# Patient Record
Sex: Male | Born: 1955 | Race: White | Hispanic: No | Marital: Married | Smoking: Former smoker
Health system: Southern US, Community
[De-identification: ages and names within clinical notes are randomized; demographics above are authoritative.]

## PROBLEM LIST (undated history)

## (undated) DIAGNOSIS — I214 Non-ST elevation (NSTEMI) myocardial infarction: Secondary | ICD-10-CM

## (undated) DIAGNOSIS — E785 Hyperlipidemia, unspecified: Secondary | ICD-10-CM

## (undated) DIAGNOSIS — Z95820 Peripheral vascular angioplasty status with implants and grafts: Secondary | ICD-10-CM

## (undated) DIAGNOSIS — I2510 Atherosclerotic heart disease of native coronary artery without angina pectoris: Secondary | ICD-10-CM

## (undated) HISTORY — PX: APPENDECTOMY: SHX54

## (undated) HISTORY — DX: Hyperlipidemia, unspecified: E78.5

## (undated) HISTORY — DX: Peripheral vascular angioplasty status with implants and grafts: Z95.820

## (undated) HISTORY — DX: Non-ST elevation (NSTEMI) myocardial infarction: I21.4

## (undated) HISTORY — DX: Atherosclerotic heart disease of native coronary artery without angina pectoris: I25.10

---

## 2008-12-29 ENCOUNTER — Emergency Department (HOSPITAL_BASED_OUTPATIENT_CLINIC_OR_DEPARTMENT_OTHER): Admission: EM | Admit: 2008-12-29 | Discharge: 2008-12-29 | Payer: Self-pay | Admitting: Emergency Medicine

## 2010-08-07 LAB — DIFFERENTIAL
Basophils Relative: 2 % — ABNORMAL HIGH (ref 0–1)
Eosinophils Absolute: 0 10*3/uL (ref 0.0–0.7)
Lymphs Abs: 1.4 10*3/uL (ref 0.7–4.0)
Monocytes Absolute: 0.4 10*3/uL (ref 0.1–1.0)
Monocytes Relative: 3 % (ref 3–12)
Neutro Abs: 11.1 10*3/uL — ABNORMAL HIGH (ref 1.7–7.7)

## 2010-08-07 LAB — URINALYSIS, ROUTINE W REFLEX MICROSCOPIC
Glucose, UA: NEGATIVE mg/dL
Hgb urine dipstick: NEGATIVE
Protein, ur: NEGATIVE mg/dL
pH: 8.5 — ABNORMAL HIGH (ref 5.0–8.0)

## 2010-08-07 LAB — COMPREHENSIVE METABOLIC PANEL
ALT: 64 U/L — ABNORMAL HIGH (ref 0–53)
Albumin: 4.4 g/dL (ref 3.5–5.2)
Alkaline Phosphatase: 79 U/L (ref 39–117)
Calcium: 10.2 mg/dL (ref 8.4–10.5)
Potassium: 3.7 mEq/L (ref 3.5–5.1)
Sodium: 142 mEq/L (ref 135–145)
Total Protein: 7.8 g/dL (ref 6.0–8.3)

## 2010-08-07 LAB — CBC
Platelets: 173 10*3/uL (ref 150–400)
RDW: 12.3 % (ref 11.5–15.5)

## 2013-10-25 DIAGNOSIS — G4700 Insomnia, unspecified: Secondary | ICD-10-CM | POA: Insufficient documentation

## 2013-10-25 DIAGNOSIS — J449 Chronic obstructive pulmonary disease, unspecified: Secondary | ICD-10-CM | POA: Insufficient documentation

## 2013-10-25 DIAGNOSIS — D693 Immune thrombocytopenic purpura: Secondary | ICD-10-CM | POA: Insufficient documentation

## 2013-10-25 DIAGNOSIS — R351 Nocturia: Secondary | ICD-10-CM | POA: Insufficient documentation

## 2015-03-03 DIAGNOSIS — Z95820 Peripheral vascular angioplasty status with implants and grafts: Secondary | ICD-10-CM

## 2015-03-03 DIAGNOSIS — I2510 Atherosclerotic heart disease of native coronary artery without angina pectoris: Secondary | ICD-10-CM

## 2015-03-03 DIAGNOSIS — I214 Non-ST elevation (NSTEMI) myocardial infarction: Secondary | ICD-10-CM

## 2015-03-03 HISTORY — DX: Atherosclerotic heart disease of native coronary artery without angina pectoris: I25.10

## 2015-03-03 HISTORY — DX: Non-ST elevation (NSTEMI) myocardial infarction: I21.4

## 2015-03-03 HISTORY — DX: Peripheral vascular angioplasty status with implants and grafts: Z95.820

## 2015-03-05 ENCOUNTER — Inpatient Hospital Stay (HOSPITAL_BASED_OUTPATIENT_CLINIC_OR_DEPARTMENT_OTHER)
Admission: EM | Admit: 2015-03-05 | Discharge: 2015-03-07 | DRG: 247 | Disposition: A | Discharge status: Home / Self Care | Payer: BLUE CROSS/BLUE SHIELD | Attending: Cardiology | Admitting: Cardiology

## 2015-03-05 ENCOUNTER — Encounter (HOSPITAL_BASED_OUTPATIENT_CLINIC_OR_DEPARTMENT_OTHER): Payer: Self-pay | Admitting: Emergency Medicine

## 2015-03-05 ENCOUNTER — Emergency Department (HOSPITAL_BASED_OUTPATIENT_CLINIC_OR_DEPARTMENT_OTHER): Payer: BLUE CROSS/BLUE SHIELD

## 2015-03-05 DIAGNOSIS — E785 Hyperlipidemia, unspecified: Secondary | ICD-10-CM | POA: Diagnosis present

## 2015-03-05 DIAGNOSIS — Z8249 Family history of ischemic heart disease and other diseases of the circulatory system: Secondary | ICD-10-CM

## 2015-03-05 DIAGNOSIS — I214 Non-ST elevation (NSTEMI) myocardial infarction: Principal | ICD-10-CM | POA: Diagnosis present

## 2015-03-05 DIAGNOSIS — R001 Bradycardia, unspecified: Secondary | ICD-10-CM | POA: Diagnosis present

## 2015-03-05 DIAGNOSIS — I213 ST elevation (STEMI) myocardial infarction of unspecified site: Secondary | ICD-10-CM | POA: Diagnosis not present

## 2015-03-05 DIAGNOSIS — Z87891 Personal history of nicotine dependence: Secondary | ICD-10-CM

## 2015-03-05 DIAGNOSIS — Z880 Allergy status to penicillin: Secondary | ICD-10-CM | POA: Diagnosis not present

## 2015-03-05 DIAGNOSIS — I2510 Atherosclerotic heart disease of native coronary artery without angina pectoris: Secondary | ICD-10-CM | POA: Diagnosis not present

## 2015-03-05 DIAGNOSIS — E876 Hypokalemia: Secondary | ICD-10-CM | POA: Diagnosis present

## 2015-03-05 DIAGNOSIS — D72829 Elevated white blood cell count, unspecified: Secondary | ICD-10-CM | POA: Diagnosis present

## 2015-03-05 LAB — CBC WITH DIFFERENTIAL/PLATELET
BASOS ABS: 0 10*3/uL (ref 0.0–0.1)
Basophils Relative: 0 %
EOS PCT: 0 %
Eosinophils Absolute: 0 10*3/uL (ref 0.0–0.7)
HEMATOCRIT: 45.6 % (ref 39.0–52.0)
Hemoglobin: 16.4 g/dL (ref 13.0–17.0)
LYMPHS ABS: 1.3 10*3/uL (ref 0.7–4.0)
LYMPHS PCT: 11 %
MCH: 30.4 pg (ref 26.0–34.0)
MCHC: 36 g/dL (ref 30.0–36.0)
MCV: 84.6 fL (ref 78.0–100.0)
MONO ABS: 0.3 10*3/uL (ref 0.1–1.0)
MONOS PCT: 3 %
NEUTROS ABS: 9.7 10*3/uL — AB (ref 1.7–7.7)
Neutrophils Relative %: 86 %
PLATELETS: 206 10*3/uL (ref 150–400)
RBC: 5.39 MIL/uL (ref 4.22–5.81)
RDW: 12.4 % (ref 11.5–15.5)
WBC: 11.4 10*3/uL — ABNORMAL HIGH (ref 4.0–10.5)

## 2015-03-05 LAB — COMPREHENSIVE METABOLIC PANEL
ALBUMIN: 4.6 g/dL (ref 3.5–5.0)
ALT: 41 U/L (ref 17–63)
ANION GAP: 12 (ref 5–15)
AST: 33 U/L (ref 15–41)
Alkaline Phosphatase: 58 U/L (ref 38–126)
BILIRUBIN TOTAL: 0.8 mg/dL (ref 0.3–1.2)
BUN: 18 mg/dL (ref 6–20)
CHLORIDE: 105 mmol/L (ref 101–111)
CO2: 21 mmol/L — AB (ref 22–32)
Calcium: 9.5 mg/dL (ref 8.9–10.3)
Creatinine, Ser: 0.99 mg/dL (ref 0.61–1.24)
GFR calc Af Amer: >60 mL/min (ref >60)
GFR calc non Af Amer: >60 mL/min (ref >60)
GLUCOSE: 138 mg/dL — AB (ref 65–99)
POTASSIUM: 3.7 mmol/L (ref 3.5–5.1)
SODIUM: 138 mmol/L (ref 135–145)
TOTAL PROTEIN: 8 g/dL (ref 6.5–8.1)

## 2015-03-05 LAB — LIPASE, BLOOD
Lipase: 34 U/L (ref 11–51)
Lipase: 26 U/L (ref 11–51)

## 2015-03-05 LAB — MAGNESIUM: Magnesium: 1.7 mg/dL (ref 1.7–2.4)

## 2015-03-05 LAB — TROPONIN I
Troponin I: 0.39 ng/mL — ABNORMAL HIGH (ref <0.031)
Troponin I: 1.01 ng/mL (ref <0.031)

## 2015-03-05 LAB — AMYLASE: AMYLASE: 39 U/L (ref 28–100)

## 2015-03-05 LAB — HEPARIN LEVEL (UNFRACTIONATED): HEPARIN UNFRACTIONATED: 0.48 [IU]/mL (ref 0.30–0.70)

## 2015-03-05 MED ORDER — ACETAMINOPHEN 325 MG PO TABS
650.0000 mg | ORAL_TABLET | ORAL | Status: DC | PRN
  Administered 2015-03-06: 650 mg via ORAL
  Filled 2015-03-05: qty 2

## 2015-03-05 MED ORDER — NITROGLYCERIN IN D5W 200-5 MCG/ML-% IV SOLN: 3.0000 ug/min | INTRAVENOUS | Status: DC

## 2015-03-05 MED ORDER — ASPIRIN 81 MG PO CHEW
81.0000 mg | CHEWABLE_TABLET | Freq: Once | ORAL | Status: AC
  Administered 2015-03-05: 324 mg via ORAL

## 2015-03-05 MED ORDER — MORPHINE SULFATE (PF) 4 MG/ML IV SOLN
4.0000 mg | Freq: Once | INTRAVENOUS | Status: AC
  Administered 2015-03-05: 4 mg via INTRAVENOUS
  Filled 2015-03-05: qty 1

## 2015-03-05 MED ORDER — HEPARIN BOLUS VIA INFUSION
4000.0000 [IU] | Freq: Once | INTRAVENOUS | Status: AC
  Administered 2015-03-05: 4000 [IU] via INTRAVENOUS

## 2015-03-05 MED ORDER — HEPARIN (PORCINE) IN NACL 100-0.45 UNIT/ML-% IJ SOLN
1000.0000 [IU]/h | INTRAMUSCULAR | Status: DC
  Administered 2015-03-05: 1000 [IU]/h via INTRAVENOUS
  Administered 2015-03-05: 14:00:00 via INTRAVENOUS

## 2015-03-05 MED ORDER — ASPIRIN 81 MG PO CHEW
CHEWABLE_TABLET | ORAL | Status: AC
  Administered 2015-03-05: 324 mg via ORAL
  Filled 2015-03-05: qty 4

## 2015-03-05 MED ORDER — ATORVASTATIN CALCIUM 80 MG PO TABS
80.0000 mg | ORAL_TABLET | Freq: Every day | ORAL | Status: DC
  Administered 2015-03-06: 80 mg via ORAL
  Filled 2015-03-05: qty 1

## 2015-03-05 MED ORDER — HEPARIN (PORCINE) IN NACL 100-0.45 UNIT/ML-% IJ SOLN
INTRAMUSCULAR | Status: AC
Start: 2015-03-05 — End: 2015-03-05
  Filled 2015-03-05: qty 250

## 2015-03-05 MED ORDER — HEPARIN (PORCINE) IN NACL 100-0.45 UNIT/ML-% IJ SOLN
INTRAMUSCULAR | Status: AC
  Filled 2015-03-05: qty 250

## 2015-03-05 MED ORDER — IOHEXOL 300 MG/ML  SOLN
25.0000 mL | Freq: Once | INTRAMUSCULAR | Status: AC | PRN
  Administered 2015-03-05: 25 mL via ORAL

## 2015-03-05 MED ORDER — ASPIRIN 81 MG PO CHEW: 324.0000 mg | CHEWABLE_TABLET | ORAL | Status: AC

## 2015-03-05 MED ORDER — ASPIRIN EC 81 MG PO TBEC: 81.0000 mg | DELAYED_RELEASE_TABLET | Freq: Every day | ORAL | Status: DC

## 2015-03-05 MED ORDER — METOCLOPRAMIDE HCL 5 MG/ML IJ SOLN
10.0000 mg | Freq: Once | INTRAMUSCULAR | Status: AC
  Administered 2015-03-05: 10 mg via INTRAVENOUS
  Filled 2015-03-05: qty 2

## 2015-03-05 MED ORDER — ASPIRIN 81 MG PO CHEW: 324.0000 mg | CHEWABLE_TABLET | Freq: Once | ORAL | Status: DC

## 2015-03-05 MED ORDER — ASPIRIN 300 MG RE SUPP: 300.0000 mg | RECTAL | Status: AC

## 2015-03-05 MED ORDER — IOHEXOL 300 MG/ML  SOLN
100.0000 mL | Freq: Once | INTRAMUSCULAR | Status: AC | PRN
  Administered 2015-03-05: 100 mL via INTRAVENOUS

## 2015-03-05 MED ORDER — ONDANSETRON HCL 4 MG/2ML IJ SOLN
4.0000 mg | Freq: Four times a day (QID) | INTRAMUSCULAR | Status: DC | PRN
  Administered 2015-03-06: 4 mg via INTRAVENOUS
  Filled 2015-03-05: qty 2

## 2015-03-05 MED ORDER — NITROGLYCERIN IN D5W 200-5 MCG/ML-% IV SOLN
0.0000 ug/min | INTRAVENOUS | Status: DC
  Administered 2015-03-05: 10 ug/min via INTRAVENOUS
  Filled 2015-03-05: qty 250

## 2015-03-05 MED ORDER — NITROGLYCERIN 0.4 MG SL SUBL: 0.4000 mg | SUBLINGUAL_TABLET | SUBLINGUAL | Status: DC | PRN

## 2015-03-05 NOTE — ED Notes (Signed)
MD at bedside. 

## 2015-03-05 NOTE — ED Notes (Signed)
Patient transported to CT via stretcher, sr x 2 up  

## 2015-03-05 NOTE — Progress Notes (Signed)
Dr. Nona DellSamuel McDowell on unit at this time, spoke with RN pertaining to patient's recent troponin and plan of care for the night.  No new orders received at this time.  Will continue to monitor.

## 2015-03-05 NOTE — H&P (Addendum)
Admit date: 03/05/2015 Referring Physician: Dr. Dione Booze Primary Cardiologist None Chief complaint/reason for admission: elevated troponin with nausea  HPI: This is a 59 yo WM with no prior past medical history who was in his USOH until 3 days ago when he started feeling poorly.  He says it is difficult to described but did not feel right.  He was very fatigued but no other symptoms.  He went to work yesterday and about 2pm he started having a funny feeling in his epigastrium that he described as a heaviness and he went to the bathroom and vomited but continued to feel bad and went home and went to bed.  He slept for a few hours and felt better.  He went to visit his brother in the hospital last night and then went to bed around 11pm and awakened at 4am and had the same heaviness in his epigastric area and could not get relief.  He started vomiting again and decided to go to the [facility].  He says that he has had some SOB but the last time was a month ago.  He did get diaphoresis with the epigastric pain yesterday and last night.  He thought he had a stomach bug.  At [facility] ER he was noted to have an elevated troponin and is now admitted at [facility] for further w/u of NSTEMI.  Currently he is complaining of continued epigastric discomfort with nausea with some belching but the belching does not improve the discomfort.      PMH:   History reviewed. No pertinent past medical history.  PSH:    Past Surgical History  Procedure Laterality Date  . Appendectomy      ALLERGIES:   Penicillins  Prior to Admit Meds:   No prescriptions prior to admission   Family HX:    Family History  Problem Relation Age of Onset  . CVA Father   . Diverticulitis Brother   . Heart attack Maternal Grandfather 47    died of MI  . Heart disease Maternal Grandfather    Social HX:    Social History   Social History  . Marital Status: Married    Spouse Name: N/A  . Number of Children: N/A  . Years of Education:  N/A   Occupational History  . Not on file.   Social History Main Topics  . Smoking status: Former Smoker    Quit date: 03/04/2009  . Smokeless tobacco: Not on file  . Alcohol Use: No  . Drug Use: Not on file  . Sexual Activity: Not on file   Other Topics Concern  . Not on file   Social History Narrative  . No narrative on file     ROS:  All 11 ROS were addressed and are negative except what is stated in the HPI  PHYSICAL EXAM Filed Vitals:   03/05/15 1742  BP: 135/95  Pulse: 63  Temp: 99 F (37.2 C)  Resp: 15   General: Well developed, well nourished, in no acute distress Head: Eyes PERRLA, No xanthomas.   Normal cephalic and atramatic  Lungs:   Clear bilaterally to auscultation and percussion. Heart:   HRRR S1 S2 Pulses are 2+ & equal.            No carotid bruit. No JVD.  No abdominal bruits. No femoral bruits. Abdomen: Bowel sounds are positive, abdomen soft without masses and mild tenderness to palpation in the epigastric region.   Extremities:   No clubbing,  cyanosis or edema.  DP +1 Neuro: Alert and oriented X 3. Psych:  Good affect, responds appropriately   Labs:   Lab Results  Component Value Date   WBC 11.4* 03/05/2015   HGB 16.4 03/05/2015   HCT 45.6 03/05/2015   MCV 84.6 03/05/2015   PLT 206 03/05/2015     Recent Labs Lab 03/05/15 0955  NA 138  K 3.7  CL 105  CO2 21*  BUN 18  CREATININE 0.99  CALCIUM 9.5  PROT 8.0  BILITOT 0.8  ALKPHOS 58  ALT 41  AST 33  GLUCOSE 138*   Lab Results  Component Value Date   TROPONINI 0.39* 03/05/2015   No results found for: PTT No results found for: INR, PROTIME  No results found for: CHOL No results found for: HDL No results found for: LDLCALC No results found for: TRIG No results found for: CHOLHDL No results found for: LDLDIRECT    Radiology:  Dg Chest 2 View  03/05/2015  CLINICAL DATA:  Chest pain with nausea and vomiting. Irregular heart rate EXAM: CHEST  2 VIEW COMPARISON:  None.  FINDINGS: The heart size and mediastinal contours are within normal limits. Both lungs are clear. The visualized skeletal structures are unremarkable. IMPRESSION: No active cardiopulmonary disease. Electronically Signed   By: Franchot Gallo M.D.   On: 03/05/2015 13:11   Ct Abdomen Pelvis W Contrast  03/05/2015  CLINICAL DATA:  Abdominal pain, nausea, vomiting, body aches, chills, status post appendectomy. EXAM: CT ABDOMEN AND PELVIS WITH CONTRAST TECHNIQUE: Multidetector CT imaging of the abdomen and pelvis was performed using the standard protocol following bolus administration of intravenous contrast. CONTRAST:  48mL OMNIPAQUE IOHEXOL 300 MG/ML SOLN, 170mL OMNIPAQUE IOHEXOL 300 MG/ML SOLN COMPARISON:  None. FINDINGS: Lung bases are unremarkable. Sagittal images of the spine are unremarkable. Enhanced liver, pancreas, spleen and adrenal glands are unremarkable. No calcified gallstones are noted within gallbladder. Kidneys are symmetrical in size and enhancement. No hydronephrosis or hydroureter. There is cortical scarring midpole of the left kidney. No small bowel obstruction. No ascites or free air. No adenopathy. No pericecal inflammation. The terminal ileum is unremarkable. Tiny umbilical hernia containing fat without evidence of acute complication. Some stool noted in right colon and transverse colon. The left colon and sigmoid colon are empty partially collapsed. Some colonic gas noted within rectum. Prostate gland and seminal vesicles are unremarkable. Urinary bladder is unremarkable. There is no inguinal adenopathy. No destructive bony lesions are noted within pelvis. IMPRESSION: 1. No acute inflammatory process within abdomen or pelvis. 2. No pericecal inflammation.  Unremarkable terminal ileum. 3. No hydronephrosis or hydroureter. 4. No small bowel obstruction. Electronically Signed   By: Lahoma Crocker M.D.   On: 03/05/2015 12:53    EKG:  NSR with IRBBB and no acute ST changes  ASSESSMENT/PLAN:   1.   Epigastric pain with N/V ? Etiology.  Patient has not felt well for 3 days and over past 24 hours has had epigastric pain with N/V and diaphoresis that is not relieved with vomiting or belching.  Started on IV Heparin gtt and continues to have discomfort.  Initial Trop is 0.39.  EKG is nonischemic.  Lipase is normal and amylase is pending.  Alk Phosis normal.  CT of abdomen was normal.  His risk factors include male sex, age > 64 and family  History of premature CAD with his maternal grandfather dying of massive MI at 73.  EKG does not show any acute changes but may have  a LCx lesion.  I have discussed findings with Dr. Angelena Form.  Will continue IV Heparin gtt.  Start IV NTG gtt.  Start ASA $RemoveB'81mg'okqjsHPr$  daily.  Hold BB due to borderline bradycardia.  Start high dose statin.  Cycle cardiac enzymes.  If troponin trends upward then Dr. Angelena Form will take to cath lab tonight.  Check 2D echo in am.  Check FLP and HbA1C in am.    Sueanne Margarita, MD  03/05/2015  6:15 PM

## 2015-03-05 NOTE — ED Notes (Signed)
Patient resting and states nausea has improved.  Patient AAO X 4.  Patient's body slightly moist.  Infusions being administered.  No redness, pain, drainage or edema at either site.

## 2015-03-05 NOTE — Progress Notes (Signed)
ANTICOAGULATION CONSULT NOTE - Initial Consult  Pharmacy Consult for heparin Indication: chest pain/ACS  Allergies  Allergen Reactions  . Penicillins     Patient Measurements: Height: 5\' 8"  (172.7 cm) Weight: 185 lb (83.915 kg) IBW/kg (Calculated) : 68.4 Heparin Dosing Weight: 83kg  Vital Signs: Temp: 98 F (36.7 C) (11/03 1106) Temp Source: Oral (11/03 1106) BP: 146/99 mmHg (11/03 1331) Pulse Rate: 58 (11/03 1331)  Labs:  Recent Labs  03/05/15 0955  HGB 16.4  HCT 45.6  PLT 206  CREATININE 0.99  TROPONINI 0.39*    Estimated Creatinine Clearance: 86.9 mL/min (by C-G formula based on Cr of 0.99).   Assessment: 89 yom with no past cardiac history [facility] with chest pain and generalized malaise. First set of cardiac enzymes positive. Heparin ordered for rule out MI. CBC within normal limits.  Goal of Therapy:  Heparin level 0.3-0.7 units/ml Monitor platelets by anticoagulation protocol: Yes   Plan:  Give 4000 units bolus x 1 Start heparin infusion at 1000 units/hr Check anti-Xa level in 6 hours and daily while on heparin Continue to monitor H&H and platelets  Sheppard CoilFrank Ruthella Kirchman PharmD., BCPS Clinical Pharmacist Pager [phone] 03/05/2015 1:45 PM

## 2015-03-05 NOTE — ED Notes (Signed)
carelink will transfer to [phone]

## 2015-03-05 NOTE — ED Notes (Signed)
Pt placed on cont cardiac monitoring with cont POX and int NBP measurements

## 2015-03-05 NOTE — ED Notes (Signed)
Presents today with abd pain, nausea, vomited x 2, last time approx 1 hour ago, states ate last night, last BM was this am

## 2015-03-05 NOTE — ED Notes (Signed)
Flu like symptoms since Tuesday.  Body aches.  Chills, hot flashes.  N/V.  Clammy.

## 2015-03-05 NOTE — ED Notes (Signed)
Pt resting quietly with eyes closed, cont on cardiac monitor, S-brady noted at this time with occ unifocal PVC now noted

## 2015-03-05 NOTE — ED Notes (Signed)
Patient returned from CT.  Pt pale.  Vitals repeated.  EKG repeated after noting troponin results.

## 2015-03-05 NOTE — Progress Notes (Signed)
ANTICOAGULATION CONSULT NOTE - FOLLOW UP    HL = 0.48 (goal 0.3 - 0.7 units/mL) Heparin dosing weight = 83 kg   Assessment: 59 YOM on IV heparin for ACS.  Heparin level therapeutic; no bleeding reported.   Plan: - Continue heparin gtt at 1000 units/hr - F/U AM labs    Shanesha Bednarz D. Laney Potashang, PharmD, BCPS 03/05/2015, 9:02 PM

## 2015-03-05 NOTE — ED Notes (Signed)
Due to variability in HR, NBP adjusted to q5715min assessments, pt cont on cardiac monitoring with cont POX, safety measures ensured secondary to IV narcotic administration. Family at bedside

## 2015-03-05 NOTE — Progress Notes (Signed)
Patient recent Troponin 1.01.  Patient denying chest pain and nausea currently. Cardiology paged with this information.

## 2015-03-05 NOTE — Progress Notes (Signed)
    Primary cardiologist: Dr. Fransico Him  Followed up on repeat troponin I this evening. Increase noted from 0.39 up to 1.01. Patient was placed on IV nitroglycerin along with heparin by Dr. Radford Pax, he has been chest pain-free recently, in fact now sleeping comfortably. I met with patient's wife, her sister and husband in the room. Updated them on the lab work. I also communicated with Dr. Angelena Form with plan now being to anticipate cardiac catheterization in the morning, sooner if he becomes unstable. Discussed with nursing.   Satira Sark, M.D., F.A.C.C.

## 2015-03-05 NOTE — ED Provider Notes (Signed)
CSN: [id number]     Arrival date & time 03/05/15  7829 History   First MD Initiated Contact with Patient 03/05/15 1114     Chief Complaint  Patient presents with  . Nausea     (Consider location/radiation/quality/duration/timing/severity/associated sxs/prior Treatment) HPI Complains of generalized malaise for the past 2 days. This morning he awakened with epigastric pain and chest pain accompanied by vomiting 3 or 4 times, "bile material". No known fever. No treatment prior to coming here last bowel movement this morning, normal, no urinary symptoms. Pain is anterior chest and epigastrium. Nonexertional. Nothing makes symptoms better or worse. No other associated symptoms . His wife reports that he has come home from work the past 2 days feeling "tired" No past medical history on file. Past Surgical History  Procedure Laterality Date  . Appendectomy     History reviewed. No pertinent family history. Social History  Substance Use Topics  . Smoking status: Former Games developer  . Smokeless tobacco: None  . Alcohol Use: None    Review of Systems  Cardiovascular: Positive for chest pain.  Gastrointestinal: Positive for nausea, vomiting and abdominal pain.  Neurological: Positive for weakness.       Generalized weakness  All other systems reviewed and are negative.     Allergies  Penicillins  Home Medications   Prior to Admission medications   Not on File   BP 156/100 mmHg  Pulse 56  Temp(Src) 98 F (36.7 C) (Oral)  Resp 18  Ht  (1.727 m)  Wt 185 lb (83.915 kg)  BMI 28.14 kg/m2  SpO2 100% Physical Exam  Constitutional: He appears well-developed and well-nourished. He appears distressed.  Appears uncomfortable  HENT:  Head: Normocephalic and atraumatic.  Mucous membranes dry  Eyes: Conjunctivae are normal. Pupils are equal, round, and reactive to light.  Neck: Neck supple. No tracheal deviation present. No thyromegaly present.  Cardiovascular: Normal rate and  regular rhythm.   No murmur heard. Pulmonary/Chest: Effort normal and breath sounds normal.  Abdominal: Soft. Bowel sounds are normal. He exhibits no distension and no mass. There is tenderness. There is no rebound and no guarding.  Tender in epigastrium  Genitourinary: Penis normal.  No flank tenderness  Musculoskeletal: Normal range of motion. He exhibits no edema or tenderness.  Neurological: He is alert. Coordination normal.  Skin: Skin is warm. No rash noted.  Mildly diaphoretic  Psychiatric: He has a normal mood and affect.  Nursing note and vitals reviewed.   ED Course  Procedures (including critical care time) Labs Review Labs Reviewed - No data to display  Imaging Review No results found. I have personally reviewed and evaluated these images and lab results as part of my medical decision-making.   EKG Interpretation None     Declines pain medicine at present  Date: 03/05/2015   1143 am  Rate: 65  Rhythm: normal sinus rhythm  QRS Axis: normal  Intervals: normal  ST/T Wave abnormalities: normal  Conduction Disutrbances: rightward ivcd  Narrative Interpretation: unremarkable    ED ECG REPORT   Date: 03/05/2015  1259 pm  Rate: 70  Rhythm: normal sinus rhythm  QRS Axis: normal  Intervals: normal  ST/T Wave abnormalities: normal  Conduction Disutrbances:none and Rightward IVCD  Narrative Interpretation:   Old EKG Reviewed: unchanged  I have personally reviewed the EKG tracing and agree no intercurrent  with the computerized printout as noted.  Initially declined pain medicine. 12:15 PM nausea is improved after treatment with intravenous Reglan. He  is now requesting pain medicine, pain is unchanged. Intravenous morphine ordered  1:20 PM Patient is asymptomatic and pain-free after treatment with intravenous morphine. 1:45 PM patient remains asymptomatic. Intravenous nitroglycerin and heparin ordered. Aspirin ordered.  Chest x-ray viewed by me Results for  orders placed or performed during the hospital encounter of 03/05/15  Comprehensive metabolic panel  Result Value Ref Range   Sodium 138 135 - 145 mmol/L   Potassium 3.7 3.5 - 5.1 mmol/L   Chloride 105 101 - 111 mmol/L   CO2 21 (L) 22 - 32 mmol/L   Glucose, Bld 138 (H) 65 - 99 mg/dL   BUN 18 6 - 20 mg/dL   Creatinine, Ser 2.13 0.61 - 1.24 mg/dL   Calcium 9.5 8.9 - 08.6 mg/dL   Total Protein 8.0 6.5 - 8.1 g/dL   Albumin 4.6 3.5 - 5.0 g/dL   AST 33 15 - 41 U/L   ALT 41 17 - 63 U/L   Alkaline Phosphatase 58 38 - 126 U/L   Total Bilirubin 0.8 0.3 - 1.2 mg/dL   GFR calc non Af Amer >60 >60 mL/min   GFR calc Af Amer >60 >60 mL/min   Anion gap 12 5 - 15  CBC with Differential/Platelet  Result Value Ref Range   WBC 11.4 (H) 4.0 - 10.5 K/uL   RBC 5.39 4.22 - 5.81 MIL/uL   Hemoglobin 16.4 13.0 - 17.0 g/dL   HCT 57.8 46.9 - 62.9 %   MCV 84.6 78.0 - 100.0 fL   MCH 30.4 26.0 - 34.0 pg   MCHC 36.0 30.0 - 36.0 g/dL   RDW 52.8 41.3 - 24.4 %   Platelets 206 150 - 400 K/uL   Neutrophils Relative % 86 %   Neutro Abs 9.7 (H) 1.7 - 7.7 K/uL   Lymphocytes Relative 11 %   Lymphs Abs 1.3 0.7 - 4.0 K/uL   Monocytes Relative 3 %   Monocytes Absolute 0.3 0.1 - 1.0 K/uL   Eosinophils Relative 0 %   Eosinophils Absolute 0.0 0.0 - 0.7 K/uL   Basophils Relative 0 %   Basophils Absolute 0.0 0.0 - 0.1 K/uL  Lipase, blood  Result Value Ref Range   Lipase 34 11 - 51 U/L  Troponin I  Result Value Ref Range   Troponin I 0.39 (H) <0.031 ng/mL   Dg Chest 2 View  03/05/2015  CLINICAL DATA:  Chest pain with nausea and vomiting. Irregular heart rate EXAM: CHEST  2 VIEW COMPARISON:  None. FINDINGS: The heart size and mediastinal contours are within normal limits. Both lungs are clear. The visualized skeletal structures are unremarkable. IMPRESSION: No active cardiopulmonary disease. Electronically Signed   By: Marlan Palau M.D.   On: 03/05/2015 13:11   Ct Abdomen Pelvis W Contrast  03/05/2015  CLINICAL  DATA:  Abdominal pain, nausea, vomiting, body aches, chills, status post appendectomy. EXAM: CT ABDOMEN AND PELVIS WITH CONTRAST TECHNIQUE: Multidetector CT imaging of the abdomen and pelvis was performed using the standard protocol following bolus administration of intravenous contrast. CONTRAST:  25mL OMNIPAQUE IOHEXOL 300 MG/ML SOLN, OMNIPAQUE IOHEXOL 300 MG/ML SOLN COMPARISON:  None. FINDINGS: Lung bases are unremarkable. Sagittal images of the spine are unremarkable. Enhanced liver, pancreas, spleen and adrenal glands are unremarkable. No calcified gallstones are noted within gallbladder. Kidneys are symmetrical in size and enhancement. No hydronephrosis or hydroureter. There is cortical scarring midpole of the left kidney. No small bowel obstruction. No ascites or free air. No adenopathy. No pericecal  inflammation. The terminal ileum is unremarkable. Tiny umbilical hernia containing fat without evidence of acute complication. Some stool noted in right colon and transverse colon. The left colon and sigmoid colon are empty partially collapsed. Some colonic gas noted within rectum. Prostate gland and seminal vesicles are unremarkable. Urinary bladder is unremarkable. There is no inguinal adenopathy. No destructive bony lesions are noted within pelvis. IMPRESSION: 1. No acute inflammatory process within abdomen or pelvis. 2. No pericecal inflammation.  Unremarkable terminal ileum. 3. No hydronephrosis or hydroureter. 4. No small bowel obstruction. Electronically Signed   By: Natasha MeadLiviu  Pop M.D.   On: 03/05/2015 12:53    MDM  I spoke with Dr. Mayford Knifeurner. Plan [facility] Continue intravenous nitroglycerin, heparin Dx NSTEMI Final diagnoses:  None   CRITICAL CARE Performed by: Doug SouJACUBOWITZ,Ashle Stief Total critical care time: 30 minutes Critical care time was exclusive of separately billable procedures and treating other patients. Critical care was necessary to treat or prevent  imminent or life-threatening deterioration. Critical care was time spent personally by me on the following activities: development of treatment plan with patient and/or surrogate as well as nursing, discussions with consultants, evaluation of patient's response to treatment, examination of patient, obtaining history from patient or surrogate, ordering and performing treatments and interventions, ordering and review of laboratory studies, ordering and review of radiographic studies, pulse oximetry and re-evaluation of patient's condition.     Doug SouSam Lebron Nauert, MD 03/05/15 1354

## 2015-03-06 ENCOUNTER — Encounter (HOSPITAL_COMMUNITY): Payer: Self-pay

## 2015-03-06 ENCOUNTER — Encounter (HOSPITAL_COMMUNITY)
Admission: EM | Disposition: A | Discharge status: Home / Self Care | Payer: Self-pay | Source: Home / Self Care | Attending: Cardiology

## 2015-03-06 ENCOUNTER — Inpatient Hospital Stay (HOSPITAL_COMMUNITY): Payer: BLUE CROSS/BLUE SHIELD

## 2015-03-06 DIAGNOSIS — I2510 Atherosclerotic heart disease of native coronary artery without angina pectoris: Secondary | ICD-10-CM

## 2015-03-06 DIAGNOSIS — I213 ST elevation (STEMI) myocardial infarction of unspecified site: Secondary | ICD-10-CM

## 2015-03-06 HISTORY — PX: CARDIAC CATHETERIZATION: SHX172

## 2015-03-06 LAB — CBC
HCT: 39.7 % (ref 39.0–52.0)
Hemoglobin: 14 g/dL (ref 13.0–17.0)
MCH: 30.6 pg (ref 26.0–34.0)
MCHC: 35.3 g/dL (ref 30.0–36.0)
MCV: 86.7 fL (ref 78.0–100.0)
PLATELETS: 184 10*3/uL (ref 150–400)
RBC: 4.58 MIL/uL (ref 4.22–5.81)
RDW: 12.5 % (ref 11.5–15.5)
WBC: 16 10*3/uL — AB (ref 4.0–10.5)

## 2015-03-06 LAB — HEPARIN LEVEL (UNFRACTIONATED): HEPARIN UNFRACTIONATED: 0.42 [IU]/mL (ref 0.30–0.70)

## 2015-03-06 LAB — LIPID PANEL
CHOL/HDL RATIO: 4.3 ratio
Cholesterol: 176 mg/dL (ref 0–200)
HDL: 41 mg/dL (ref >40)
LDL CALC: 121 mg/dL — AB (ref 0–99)
TRIGLYCERIDES: 72 mg/dL (ref <150)
VLDL: 14 mg/dL (ref 0–40)

## 2015-03-06 LAB — BASIC METABOLIC PANEL
ANION GAP: 9 (ref 5–15)
BUN: 11 mg/dL (ref 6–20)
CALCIUM: 8.1 mg/dL — AB (ref 8.9–10.3)
CHLORIDE: 106 mmol/L (ref 101–111)
CO2: 21 mmol/L — AB (ref 22–32)
Creatinine, Ser: 0.91 mg/dL (ref 0.61–1.24)
GFR calc non Af Amer: >60 mL/min (ref >60)
GLUCOSE: 129 mg/dL — AB (ref 65–99)
POTASSIUM: 3.3 mmol/L — AB (ref 3.5–5.1)
Sodium: 136 mmol/L (ref 135–145)

## 2015-03-06 LAB — TSH: TSH: 0.588 u[IU]/mL (ref 0.350–4.500)

## 2015-03-06 LAB — TROPONIN I
TROPONIN I: 2.3 ng/mL — AB (ref <0.031)
Troponin I: 3.22 ng/mL (ref <0.031)

## 2015-03-06 LAB — POCT ACTIVATED CLOTTING TIME: Activated Clotting Time: 411 seconds

## 2015-03-06 LAB — PROTIME-INR
INR: 1.06 (ref 0.00–1.49)
Prothrombin Time: 14 seconds (ref 11.6–15.2)

## 2015-03-06 SURGERY — LEFT HEART CATH AND CORONARY ANGIOGRAPHY
Anesthesia: LOCAL

## 2015-03-06 MED ORDER — HEPARIN (PORCINE) IN NACL 2-0.9 UNIT/ML-% IJ SOLN
INTRAMUSCULAR | Status: AC
  Filled 2015-03-06: qty 1000

## 2015-03-06 MED ORDER — SODIUM CHLORIDE 0.9 % WEIGHT BASED INFUSION: 3.0000 mL/kg/h | INTRAVENOUS | Status: DC

## 2015-03-06 MED ORDER — BIVALIRUDIN BOLUS VIA INFUSION - CUPID
INTRAVENOUS | Status: DC | PRN
  Administered 2015-03-06: 65.25 mg via INTRAVENOUS

## 2015-03-06 MED ORDER — TICAGRELOR 90 MG PO TABS
ORAL_TABLET | ORAL | Status: DC | PRN
  Administered 2015-03-06: 180 mg via ORAL

## 2015-03-06 MED ORDER — BACLOFEN 10 MG PO TABS
5.0000 mg | ORAL_TABLET | Freq: Three times a day (TID) | ORAL | Status: DC | PRN
  Administered 2015-03-06: 5 mg via ORAL
  Filled 2015-03-06 (×3): qty 0.5

## 2015-03-06 MED ORDER — HEART ATTACK BOUNCING BOOK
Freq: Once | Status: AC
  Administered 2015-03-06: 13:00:00
  Filled 2015-03-06: qty 1

## 2015-03-06 MED ORDER — MIDAZOLAM HCL 2 MG/2ML IJ SOLN
INTRAMUSCULAR | Status: AC
  Filled 2015-03-06: qty 4

## 2015-03-06 MED ORDER — SODIUM CHLORIDE 0.9 % IJ SOLN
3.0000 mL | Freq: Two times a day (BID) | INTRAMUSCULAR | Status: DC
  Administered 2015-03-06: 3 mL via INTRAVENOUS

## 2015-03-06 MED ORDER — SODIUM CHLORIDE 0.9 % IV SOLN
250.0000 mg | INTRAVENOUS | Status: DC | PRN
  Administered 2015-03-06: 1.75 mg/kg/h via INTRAVENOUS

## 2015-03-06 MED ORDER — LIDOCAINE HCL (PF) 1 % IJ SOLN
INTRAMUSCULAR | Status: AC
  Filled 2015-03-06: qty 30

## 2015-03-06 MED ORDER — IOHEXOL 350 MG/ML SOLN
INTRAVENOUS | Status: DC | PRN
Start: 2015-03-06 — End: 2015-03-06
  Administered 2015-03-06: 145 mL via INTRACARDIAC

## 2015-03-06 MED ORDER — NITROGLYCERIN 1 MG/10 ML FOR IR/CATH LAB
INTRA_ARTERIAL | Status: AC
  Filled 2015-03-06: qty 10

## 2015-03-06 MED ORDER — HEPARIN SODIUM (PORCINE) 1000 UNIT/ML IJ SOLN
INTRAMUSCULAR | Status: DC | PRN
  Administered 2015-03-06: 4500 [IU] via INTRAVENOUS

## 2015-03-06 MED ORDER — SODIUM CHLORIDE 0.9 % IJ SOLN: 3.0000 mL | INTRAMUSCULAR | Status: DC | PRN

## 2015-03-06 MED ORDER — FENTANYL CITRATE (PF) 100 MCG/2ML IJ SOLN
INTRAMUSCULAR | Status: AC
  Filled 2015-03-06: qty 4

## 2015-03-06 MED ORDER — NITROGLYCERIN IN D5W 200-5 MCG/ML-% IV SOLN: 0.0000 ug/min | INTRAVENOUS | Status: DC

## 2015-03-06 MED ORDER — SODIUM CHLORIDE 0.9 % IV SOLN: 250.0000 mL | INTRAVENOUS | Status: DC | PRN

## 2015-03-06 MED ORDER — MIDAZOLAM HCL 2 MG/2ML IJ SOLN
INTRAMUSCULAR | Status: DC | PRN
  Administered 2015-03-06: 2 mg via INTRAVENOUS

## 2015-03-06 MED ORDER — TICAGRELOR 90 MG PO TABS
ORAL_TABLET | ORAL | Status: AC
  Filled 2015-03-06: qty 1

## 2015-03-06 MED ORDER — SODIUM CHLORIDE 0.9 % IJ SOLN: 3.0000 mL | Freq: Two times a day (BID) | INTRAMUSCULAR | Status: DC

## 2015-03-06 MED ORDER — BIVALIRUDIN 250 MG IV SOLR
INTRAVENOUS | Status: AC
  Filled 2015-03-06: qty 250

## 2015-03-06 MED ORDER — ASPIRIN 81 MG PO CHEW: 81.0000 mg | CHEWABLE_TABLET | ORAL | Status: DC

## 2015-03-06 MED ORDER — VERAPAMIL HCL 2.5 MG/ML IV SOLN
INTRAVENOUS | Status: AC
  Filled 2015-03-06: qty 2

## 2015-03-06 MED ORDER — INFLUENZA VAC SPLIT QUAD 0.5 ML IM SUSY
0.5000 mL | PREFILLED_SYRINGE | Freq: Once | INTRAMUSCULAR | Status: AC
  Administered 2015-03-06: 14:00:00 0.5 mL via INTRAMUSCULAR
  Filled 2015-03-06: qty 0.5

## 2015-03-06 MED ORDER — ASPIRIN EC 81 MG PO TBEC
81.0000 mg | DELAYED_RELEASE_TABLET | Freq: Every day | ORAL | Status: DC
  Administered 2015-03-07: 81 mg via ORAL
  Filled 2015-03-06: qty 1

## 2015-03-06 MED ORDER — HEPARIN SODIUM (PORCINE) 1000 UNIT/ML IJ SOLN
INTRAMUSCULAR | Status: AC
  Filled 2015-03-06: qty 1

## 2015-03-06 MED ORDER — ANGIOPLASTY BOOK
Freq: Once | Status: AC
Start: 2015-03-06 — End: 2015-03-06
  Administered 2015-03-06: 13:00:00
  Filled 2015-03-06: qty 1

## 2015-03-06 MED ORDER — VERAPAMIL HCL 2.5 MG/ML IV SOLN
INTRAVENOUS | Status: DC | PRN
  Administered 2015-03-06: 08:00:00 via INTRA_ARTERIAL

## 2015-03-06 MED ORDER — SODIUM CHLORIDE 0.9 % WEIGHT BASED INFUSION
3.0000 mL/kg/h | INTRAVENOUS | Status: AC
  Administered 2015-03-06: 10:00:00 3 mL/kg/h via INTRAVENOUS

## 2015-03-06 MED ORDER — METOPROLOL TARTRATE 25 MG PO TABS
25.0000 mg | ORAL_TABLET | Freq: Two times a day (BID) | ORAL | Status: DC
  Administered 2015-03-06 – 2015-03-07 (×3): 25 mg via ORAL
  Filled 2015-03-06 (×3): qty 1

## 2015-03-06 MED ORDER — FENTANYL CITRATE (PF) 100 MCG/2ML IJ SOLN
INTRAMUSCULAR | Status: DC | PRN
  Administered 2015-03-06: 50 ug via INTRAVENOUS

## 2015-03-06 MED ORDER — TICAGRELOR 90 MG PO TABS
90.0000 mg | ORAL_TABLET | Freq: Two times a day (BID) | ORAL | Status: DC
  Administered 2015-03-06 – 2015-03-07 (×2): 90 mg via ORAL
  Filled 2015-03-06 (×2): qty 1

## 2015-03-06 MED ORDER — LIDOCAINE HCL (PF) 1 % IJ SOLN
INTRAMUSCULAR | Status: DC | PRN
  Administered 2015-03-06: 09:00:00

## 2015-03-06 MED ORDER — SODIUM CHLORIDE 0.9 % WEIGHT BASED INFUSION: 1.0000 mL/kg/h | INTRAVENOUS | Status: DC

## 2015-03-06 SURGICAL SUPPLY — 17 items
BALLN EMERGE MR 2.5X12 (BALLOONS) ×2
BALLN ~~LOC~~ EUPHORA RX 3.25X27 (BALLOONS) ×2
BALLOON EMERGE MR 2.5X12 (BALLOONS) ×1 IMPLANT
BALLOON ~~LOC~~ EUPHORA RX 3.25X27 (BALLOONS) ×1 IMPLANT
CATH HEARTRAIL IKARI 6F IR1.0 (CATHETERS) ×2 IMPLANT
CATH INFINITI 5 FR JL3.5 (CATHETERS) ×2 IMPLANT
CATH OPTITORQUE TIG 4.0 5F (CATHETERS) ×2 IMPLANT
DEVICE RAD COMP TR BAND LRG (VASCULAR PRODUCTS) ×2 IMPLANT
GLIDESHEATH SLEND A-KIT 6F 22G (SHEATH) ×2 IMPLANT
KIT ENCORE 26 ADVANTAGE (KITS) ×2 IMPLANT
KIT HEART LEFT (KITS) ×2 IMPLANT
PACK CARDIAC CATHETERIZATION (CUSTOM PROCEDURE TRAY) ×2 IMPLANT
STENT PROMUS PREM MR 3.0X32 (Permanent Stent) ×2 IMPLANT
TRANSDUCER W/STOPCOCK (MISCELLANEOUS) ×2 IMPLANT
TUBING CIL FLEX 10 FLL-RA (TUBING) ×2 IMPLANT
WIRE HI TORQ BMW 190CM (WIRE) ×2 IMPLANT
WIRE SAFE-T 1.5MM-J .035X260CM (WIRE) ×2 IMPLANT

## 2015-03-06 NOTE — Research (Signed)
Benjamin Hoffman has agreed to participate in the [place]. The research study will provide ASA and Brilinta to the subject at discharge. He will take ASA 81 mg daily and Brilinta 90 mg bid.  He has been given verbal and written discharge instructions.  At 3 months, if study criteria are still met, he will be randomized to continue with the ASA and Brilinta or Brilinta alone for the next 9 months.  Blossom Hoops, RN, Research Nurse, [phone]

## 2015-03-06 NOTE — Progress Notes (Signed)
[id number] Cardiology on-call MD paged re: 2nd troponin elevated at 2.30, pt has been pain free all night with VS fairly stable, HR upper 50's to low 60's with B/P running in the 140-150's/80-90's. RN went into check on the pt prior to paging an he is sleeping soundly with no s/s of complications, will continue to monitor. [id number] Dr Gala RomneyBensimhon called back and gave orders for pt* to remain NPO, no other orders received, will continue to monitor.

## 2015-03-06 NOTE — Progress Notes (Signed)
TR BAND REMOVAL  LOCATION:    right radial  DEFLATED PER PROTOCOL:    Yes.    TIME BAND OFF / DRESSING APPLIED:    1400   SITE UPON ARRIVAL:    Level 0  SITE AFTER BAND REMOVAL:    Level 0  REVERSE ALLEN'S TEST:      CIRCULATION SENSATION AND MOVEMENT:    Within Normal Limits   Yes.    COMMENTS:

## 2015-03-06 NOTE — Progress Notes (Signed)
  Echocardiogram 2D Echocardiogram has been performed.  Delcie RochENNINGTON, Pradeep Beaubrun 03/06/2015, 4:45 PM

## 2015-03-06 NOTE — Interval H&P Note (Signed)
History and Physical Interval Note:  03/06/2015 7:46 AM  Benjamin BaileyMark Hoffman  has presented today for surgery, with the diagnosis of NSTEMI  The various methods of treatment have been discussed with the patient and family. After consideration of risks, benefits and other options for treatment, the patient has consented to  Procedure(s): Left Heart Cath and Coronary Angiography (N/A) as a surgical intervention .  The patient's history has been reviewed, patient examined, no change in status, stable for surgery.  I have reviewed the patient's chart and labs.  Questions were answered to the patient's satisfaction.     Benjamin Hoffman W  Cath Lab Visit (complete for each Cath Lab visit)  Clinical Evaluation Leading to the Procedure:   ACS: Yes.    Non-ACS:    Anginal Classification: CCS IV  Anti-ischemic medical therapy: Minimal Therapy (1 class of medications)  Non-Invasive Test Results: No non-invasive testing performed  Prior CABG: No previous CABG   TIMI Score  Patient Information:  TIMI Score is 3   UA/NSTEMI and intermediate-risk features (e.g., TIMI score 3-4) for short-term risk of death or nonfatal MI  Revascularization of the presumed culprit artery   A (9)  Indication: 10; Score: 9  Benjamin Hoffman W, MD

## 2015-03-06 NOTE — Research (Signed)
[facility] Research Study    Subject Name: Benjamin Hoffman  Subject met inclusion and exclusion criteria.  The informed consent form, study requirements and expectations were reviewed with the subject and questions and concerns were addressed prior to the signing of the consent form.  The subject verbalized understanding of the trial requirements.  The subject agreed to participate in the [place] trial and signed the informed consent.  The informed consent was obtained prior to performance of any protocol-specific procedures for the subject.  A copy of the signed informed consent was given to the subject and a copy was placed in the subject's medical record.  Blossom Hoops 03/06/2015, 2:14 PM

## 2015-03-06 NOTE — Progress Notes (Signed)
CM spoke with pt regarding Brilinta and provided pt with booklet with 30day free card and copay card enclosed. CM explained card usage.  Pt verbally stated understanding of how to use both cards.[facility] pharmacy/ [place] called per CM and confirmed medication is in stock. Gae Gallopngela Kamauri Kathol RN,BSN,CM [phone]

## 2015-03-06 NOTE — Progress Notes (Signed)
Heparin via IV has been stopped at this time per cath lab orders.

## 2015-03-07 LAB — BASIC METABOLIC PANEL
Anion gap: 7 (ref 5–15)
BUN: 11 mg/dL (ref 6–20)
CHLORIDE: 103 mmol/L (ref 101–111)
CO2: 26 mmol/L (ref 22–32)
Calcium: 8.6 mg/dL — ABNORMAL LOW (ref 8.9–10.3)
Creatinine, Ser: 0.86 mg/dL (ref 0.61–1.24)
GFR calc Af Amer: >60 mL/min (ref >60)
GFR calc non Af Amer: >60 mL/min (ref >60)
GLUCOSE: 107 mg/dL — AB (ref 65–99)
POTASSIUM: 3.4 mmol/L — AB (ref 3.5–5.1)
SODIUM: 136 mmol/L (ref 135–145)

## 2015-03-07 LAB — CBC
HEMATOCRIT: 39.5 % (ref 39.0–52.0)
HEMOGLOBIN: 13.9 g/dL (ref 13.0–17.0)
MCH: 30.3 pg (ref 26.0–34.0)
MCHC: 35.2 g/dL (ref 30.0–36.0)
MCV: 86.2 fL (ref 78.0–100.0)
Platelets: 171 10*3/uL (ref 150–400)
RBC: 4.58 MIL/uL (ref 4.22–5.81)
RDW: 12.2 % (ref 11.5–15.5)
WBC: 11.2 10*3/uL — ABNORMAL HIGH (ref 4.0–10.5)

## 2015-03-07 LAB — HEMOGLOBIN A1C
Hgb A1c MFr Bld: 5.7 % — ABNORMAL HIGH (ref 4.8–5.6)
Mean Plasma Glucose: 117 mg/dL

## 2015-03-07 MED ORDER — TICAGRELOR 90 MG PO TABS: 90.0000 mg | ORAL_TABLET | Freq: Two times a day (BID) | ORAL | Status: DC

## 2015-03-07 MED ORDER — ASPIRIN 81 MG PO TBEC: 81.0000 mg | DELAYED_RELEASE_TABLET | Freq: Every day | ORAL | Status: DC

## 2015-03-07 MED ORDER — ATORVASTATIN CALCIUM 80 MG PO TABS: 80.0000 mg | ORAL_TABLET | Freq: Every day | ORAL | Status: DC

## 2015-03-07 MED ORDER — METOPROLOL TARTRATE 25 MG PO TABS: 25.0000 mg | ORAL_TABLET | Freq: Two times a day (BID) | ORAL | Status: DC

## 2015-03-07 MED ORDER — NITROGLYCERIN 0.4 MG SL SUBL: 0.4000 mg | SUBLINGUAL_TABLET | SUBLINGUAL | Status: DC | PRN

## 2015-03-07 MED ORDER — POTASSIUM CHLORIDE CRYS ER 20 MEQ PO TBCR
40.0000 meq | EXTENDED_RELEASE_TABLET | Freq: Once | ORAL | Status: AC
  Administered 2015-03-07: 08:00:00 40 meq via ORAL
  Filled 2015-03-07: qty 2

## 2015-03-07 NOTE — Discharge Summary (Signed)
Physician Discharge Summary   Cardiologist:Turner-new  Patient ID: Benjamin Hoffman MRN: [id number] DOB/AGE: 1956/03/20 59 y.o.  Admit date: 03/05/2015 Discharge date: 03/07/2015  Admission Diagnoses:  NSTEMI  Discharge Diagnoses:  Active Problems:   NSTEMI (non-ST elevated myocardial infarction) (HCC)   Hypokalemia  Discharged Condition: stable  Hospital Course:   This is a 59 yo WM with no prior past medical history who was in his USOH until 3 days ago when he started feeling poorly. He says it is difficult to described but did not feel right. He was very fatigued but no other symptoms. He went to work yesterday and about 2pm he started having a funny feeling in his epigastrium that he described as a heaviness and he went to the bathroom and vomited but continued to feel bad and went home and went to bed. He slept for a few hours and felt better. He went to visit his brother in the hospital last night and then went to bed around 11pm and awakened at 4am and had the same heaviness in his epigastric area and could not get relief. He started vomiting again and decided to go to the [facility]. He says that he has had some SOB but the last time was a month ago. He did get diaphoresis with the epigastric pain yesterday and last night. He thought he had a stomach bug. At [facility] ER he was noted to have an elevated troponin and is now admitted at [facility] for further w/u of NSTEMI. Currently he is complaining of continued epigastric discomfort with nausea with some belching but the belching does not improve the discomfort.   Admitted and started on IV heparin.  Troponin increased to 3.22(last).  SP LHC revealing prox RCA to mid RCA lesion, 100% stenosed. Post intervention, there is a 0% residual stenosis.  RPDA lesion, 100% stenosed - distal embolization. There is faint TIMI 1 flow to the apical segment with competetive flow from the LAD collaterals. The left ventricular systolic function is normal.  Normal LVEDP. Normal EF and WM on echo.  Enrolled in twilight study. ASA, brilinta, lopressor 25, statin. Lipids and LFTs in 6 weeks.  Potassium was replaced. WBC's trending down. May have had GI illness as well given N/V and epigastric pain. Appetite is returning. The patient was seen by Dr. Donnie Aho who felt he was stable for DC home.    Consults: Cardiac Rehab  Significant Diagnostic Studies: Echo Study Conclusions  - Left ventricle: The cavity size was normal. Wall thickness was normal. Systolic function was normal. The estimated ejection fraction was in the range of 55% to 60%. Wall motion was normal; there were no regional wall motion abnormalities. Left ventricular diastolic function parameters were normal.  Left heart cath Conclusion    1. Prox RCA to Mid RCA lesion, 100% stenosed. Post intervention, there is a 0% residual stenosis. 2. RPDA lesion, 100% stenosed - distal embolization. There is faint TIMI 1 flow to the apical segment with competetive flow from the LAD collaterals 3. The left ventricular systolic function is normal. 4. Normal LVEDP   Silent occlusion of the proximal RCA with diffuse disease following the initial occlusion. There is actually a tandem 90 and 80% stenosis after the original 100% stenosis. These were covered with a single Promus Premier DES stent postdilated to 3.3 mm.  Thankfully he had preserved LVEF with normal LVEDP.    Diagnostic Diagram           Post-Intervention Diagram  Treatments: See above  Discharge Exam: Blood pressure 169/103, pulse 88, temperature 98.4 F (36.9 C), temperature source Oral, resp. rate 18, height 5\' 8"  (1.727 m), weight 188 lb 0.8 oz (85.3 kg), SpO2 100 %.   Disposition: Final discharge disposition not confirmed      Discharge Instructions    Diet - low sodium heart healthy    Complete by:  As directed      Discharge instructions    Complete by:  As directed   No lifting  with right arm for three days     Increase activity slowly    Complete by:  As directed             Medication List    TAKE these medications        aspirin 81 MG EC tablet  Take 1 tablet (81 mg total) by mouth daily.     atorvastatin 80 MG tablet  Commonly known as:  LIPITOR  Take 1 tablet (80 mg total) by mouth daily at 6 PM.     metoprolol tartrate 25 MG tablet  Commonly known as:  LOPRESSOR  Take 1 tablet (25 mg total) by mouth 2 (two) times daily.     nitroGLYCERIN 0.4 MG SL tablet  Commonly known as:  NITROSTAT  Place 1 tablet (0.4 mg total) under the tongue every 5 (five) minutes x 3 doses as needed for chest pain.     ticagrelor 90 MG Tabs tablet  Commonly known as:  BRILINTA  Take 1 tablet (90 mg total) by mouth 2 (two) times daily.        Greater than 30 minutes was spent completing the patient's discharge.    SignedWilburt Finlay: Laura-Lee Villegas, PAC 03/07/2015, 8:34 AM

## 2015-03-07 NOTE — Research (Signed)
Mr. Benjamin Hoffman is participating in the [employer] Research Study. He was dispensed the following study medications:             [id number] and [id number].  Subject was given a copy of his signed ICF and written discharge instructions for the study.Claire Shown. Meghan Warshawsky, RN, Research Nurse

## 2015-03-07 NOTE — Progress Notes (Signed)
    Subjective: He reports waking up soaked last night.  No SOB, cough, CP  Objective: Vital signs in last 24 hours: Temp:  [98 F (36.7 C)-98.7 F (37.1 C)] 98.7 F (37.1 C) (11/05 0404) Pulse Rate:  [0-102] 73 (11/05 0404) Resp:  [0-25] 20 (11/05 0404) BP: (111-160)/(73-99) 151/96 mmHg (11/05 0404) SpO2:  [0 %-99 %] 99 % (11/05 0404) Weight:  [188 lb 0.8 oz (85.3 kg)] 188 lb 0.8 oz (85.3 kg) (11/05 0404) Last BM Date: 03/04/15  Intake/Output from previous day: 11/04 0701 - 11/05 0700 In: -  Out: 1600 [Urine:1600] Intake/Output this shift:    Medications Scheduled Meds: . aspirin EC  81 mg Oral Daily  . atorvastatin  80 mg Oral q1800  . metoprolol tartrate  25 mg Oral BID  . sodium chloride  3 mL Intravenous Q12H  . ticagrelor  90 mg Oral BID   Continuous Infusions: . nitroGLYCERIN     PRN Meds:.sodium chloride, acetaminophen, baclofen, nitroGLYCERIN, ondansetron (ZOFRAN) IV, sodium chloride  PE: General appearance: alert, cooperative and no distress Lungs: clear to auscultation bilaterally Heart: regular rate and rhythm, S1, S2 normal, no murmur, click, rub or gallop Abdomen: +BS, Nontender Extremities: No LEE Pulses: 2+ and symmetric Skin: Warm and dry Neurologic: Grossly normal  Lab Results:   Recent Labs  03/05/15 0955 03/06/15 0023 03/07/15 0620  WBC 11.4* 16.0* 11.2*  HGB 16.4 14.0 13.9  HCT 45.6 39.7 39.5  PLT 206 184 171   BMET  Recent Labs  03/05/15 0955 03/06/15 0023  NA 138 136  K 3.7 3.3*  CL 105 106  CO2 21* 21*  GLUCOSE 138* 129*  BUN 18 11  CREATININE 0.99 0.91  CALCIUM 9.5 8.1*   PT/INR  Recent Labs  03/06/15 0609  LABPROT 14.0  INR 1.06   Cholesterol  Recent Labs  03/06/15 0023  CHOL 176   Lipid Panel     Component Value Date/Time   CHOL 176 03/06/2015 0023   TRIG 72 03/06/2015 0023   HDL 41 03/06/2015 0023   CHOLHDL 4.3 03/06/2015 0023   VLDL 14 03/06/2015 0023   LDLCALC 121* 03/06/2015 0023    Post-Intervention Diagram          Cardiac Panel (last 3 results)  Recent Labs  03/05/15 1840 03/06/15 0023 03/06/15 0620  TROPONINI 1.01* 2.30* 3.22*     Assessment/Plan  Active Problems:   NSTEMI (non-ST elevated myocardial infarction) (HCC)   Hyperlipidemia   Leukocytosis  SP LHC revealing prox RCA to mid RCA lesion, 100% stenosed. Post intervention, there is a 0% residual stenosis. RPDA lesion, 100% stenosed - distal embolization. There is faint TIMI 1 flow to the apical segment with competetive flow from the LAD collaterals. The left ventricular systolic function is normal.  Normal LVEDP.  Normal EF and WM on echo.  Enrolled in twilight study.  ASA, brilinta, lopressor 25, statin. Lipids and LFTs in 6 weeks.   WBC's trending down.  May have had GI illness as well.  Appetite is returning.   FU two weeks with APP.   LOS: 2 days    HAGER, BRYAN PA-C 03/07/2015 7:12 AM  Patient seen and examined.  Agree with above.  Cath site fine.  Darden PalmerW. Spencer Tilley, Jr. MD [facility] HospitalFACC

## 2015-03-07 NOTE — Progress Notes (Signed)
CARDIAC REHAB PHASE I   PRE:  Rate/Rhythm: 88 SR  BP:  Sitting: 169/103        SaO2: 99 RA  MODE:  Ambulation: 1000 ft   POST:  Rate/Rhythm: 94 SR  BP:  Sitting: 140/96         SaO2: 98 RA  Pt ambulated 1000 ft on RA, independent, brisk, steady gait, tolerated well.  Pt denies cp, dizziness, DOE, declined rest stop. Completed MI/stent education.  Reviewed risk factors, anti-platelet therapy, stent card, activity restrictions, ntg, exercise, heart healthy diet,  portion control, and phase 2 cardiac rehab. Pt verbalized understanding. Pt agrees to phase 2 cardiac rehab referral, will send to [facility]. Pt very receptive to education. Pt to bed after walk, call bell within reach.   0800-0900   Joylene GrapesMonge, Rini Moffit C, RN, BSN 03/07/2015 9:02 AM

## 2015-03-09 ENCOUNTER — Other Ambulatory Visit: Payer: Self-pay | Admitting: *Deleted

## 2015-03-09 MED ORDER — AMBULATORY NON FORMULARY MEDICATION: 81.0000 mg | Freq: Every day | Status: DC

## 2015-03-09 MED ORDER — AMBULATORY NON FORMULARY MEDICATION: 90.0000 mg | Freq: Two times a day (BID) | Status: DC

## 2015-03-23 ENCOUNTER — Encounter: Payer: Self-pay | Admitting: Cardiology

## 2015-03-23 ENCOUNTER — Ambulatory Visit (INDEPENDENT_AMBULATORY_CARE_PROVIDER_SITE_OTHER): Payer: BLUE CROSS/BLUE SHIELD | Admitting: Cardiology

## 2015-03-23 VITALS — BP 124/62 | HR 74 | Ht 68.0 in | Wt 177.0 lb

## 2015-03-23 DIAGNOSIS — E785 Hyperlipidemia, unspecified: Secondary | ICD-10-CM | POA: Diagnosis not present

## 2015-03-23 DIAGNOSIS — I10 Essential (primary) hypertension: Secondary | ICD-10-CM

## 2015-03-23 DIAGNOSIS — I2510 Atherosclerotic heart disease of native coronary artery without angina pectoris: Secondary | ICD-10-CM | POA: Diagnosis not present

## 2015-03-23 DIAGNOSIS — I214 Non-ST elevation (NSTEMI) myocardial infarction: Secondary | ICD-10-CM | POA: Diagnosis not present

## 2015-03-23 NOTE — Patient Instructions (Signed)
Medication Instructions:  None  Labwork: Your physician recommends that you return for lab work in: 4 weeks (Lipids, CMET)   Testing/Procedures: None  Follow-Up: Your physician recommends that you schedule a follow-up appointment in: 6-8 weeks with Dr. Mayford Knifeurner.  Any Other Special Instructions Will Be Listed Below (If Applicable).  You have been referred to Cardiac Rehab at [facility].    If you need a refill on your cardiac medications before your next appointment, please call your pharmacy.

## 2015-03-23 NOTE — Progress Notes (Signed)
Cardiology Office Note   Date:  03/23/2015   ID:  Benjamin Hoffman, DOB Sep 25, 1955, MRN [id number]  PCP:  Benjamin Peppers, MD  Cardiologist:  Dr. Radford Pax new    Chief Complaint  Patient presents with  . Hospitalization Follow-up    NSTEMI - no chest pain, no SOB       History of Present Illness: Benjamin Hoffman is a 59 y.o. male who presents for post hospital visit for NSTEMI, cath with 100% RCA stenosis undergoing PCI with promus premier stent-DES EF preserved. 55-60-%.   Troponin 3.22.  PT in TWILIGHT STUDY.   He continues ASA, brilinta, lopressor 25, statin.   Discharged 03/07/15.   He has done well since discharge no chest pain and no SOB.  He is walking 30 min once or twice a day and he has returned to work.  He is interested in cardiac rehab but prefers to go in [facility].  He has had no further N&V. He is taking his meds. I answered all questions.  Past Medical History  Diagnosis Date  . Medical history non-contributory     Past Surgical History  Procedure Laterality Date  . Appendectomy    . Cardiac catheterization N/A 03/06/2015    Procedure: Left Heart Cath and Coronary Angiography;  Surgeon: Leonie Man, MD;  Location: [place] [facility];  Service: Cardiovascular;  Laterality: N/A;  . Cardiac catheterization N/A 03/06/2015    Procedure: Coronary Stent Intervention;  Surgeon: Leonie Man, MD;  Location: [place] [facility];  Service: Cardiovascular;  Laterality: N/A;     Current Outpatient Prescriptions  Medication Sig Dispense Refill  . AMBULATORY NON FORMULARY MEDICATION Take 90 mg by mouth 2 (two) times daily. Medication Name: BRILINTA 90 mg BID (TWILIGHT Research study provided)    . AMBULATORY NON FORMULARY MEDICATION Take 81 mg by mouth daily. Medication Name: ASA 81 mg Daily (TWILIGHT Research Study Provided)    . atorvastatin (LIPITOR) 80 MG tablet Take 1 tablet (80 mg total) by mouth daily at 6 PM. 30 tablet 11  . finasteride (PROSCAR) 5 MG tablet Take 2.5 mg  by mouth daily.    . metoprolol tartrate (LOPRESSOR) 25 MG tablet Take 1 tablet (25 mg total) by mouth 2 (two) times daily. 60 tablet 11  . nitroGLYCERIN (NITROSTAT) 0.4 MG SL tablet Place 1 tablet (0.4 mg total) under the tongue every 5 (five) minutes x 3 doses as needed for chest pain. 25 tablet 12   No current facility-administered medications for this visit.    Allergies:   Penicillins    Social History:  The patient  reports that he quit smoking about 6 years ago. He does not have any smokeless tobacco history on file. He reports that he does not drink alcohol or use illicit drugs.   Family History:  The patient's family history includes CVA in his father; Diverticulitis in his brother; Heart attack (age of onset: 58) in his maternal grandfather; Heart disease in his maternal grandfather; Hypertension in his father.    ROS:  General:no colds or fevers, + weight decrease of 11 lbs Skin:no rashes or ulcers HEENT:no blurred vision, no congestion CV:see HPI PUL:see HPI GI:no diarrhea constipation or melena, no indigestion GU:no hematuria, no dysuria MS:no joint pain, no claudication Neuro:no syncope, no lightheadedness Endo:no diabetes, no thyroid disease  Wt Readings from Last 3 Encounters:  03/23/15 177 lb (80.287 kg)  03/07/15 188 lb 0.8 oz (85.3 kg)     PHYSICAL EXAM: VS:  BP 124/62 mmHg  Pulse 74  Ht _0  (1.727 m)  Wt 177 lb (80.287 kg)  BMI 26.92 kg/m2 , BMI Body mass index is 26.92 kg/(m^2). General:Pleasant affect, NAD Skin:Warm and dry, brisk capillary refill HEENT:normocephalic, sclera clear, mucus membranes moist Neck:supple, no JVD, no bruits  Heart:S1S2 RRR without murmur, gallup, rub or click Lungs:clear without rales, rhonchi, or wheezes RSW:NIOE, non tender, + BS, do not palpate liver spleen or masses Ext:no lower ext edema, 2+ pedal pulses, 2+ radial pulses- cath site stable.  Neuro:alert and oriented X 3, MAE, follows commands, + facial  symmetry    EKG:  EKG is ordered today. The ekg ordered today demonstrates SR at 30 flipped T wave III, secondary to MI.  Otherwise no acute changes.    Recent Labs: 03/05/2015: ALT 41; Magnesium 1.7 03/06/2015: TSH 0.588 03/07/2015: BUN 11; Creatinine, Ser 0.86; Hemoglobin 13.9; Platelets 171; Potassium 3.4*; Sodium 136    Lipid Panel    Component Value Date/Time   CHOL 176 03/06/2015 0023   TRIG 72 03/06/2015 0023   HDL 41 03/06/2015 0023   CHOLHDL 4.3 03/06/2015 0023   VLDL 14 03/06/2015 0023   LDLCALC 121* 03/06/2015 0023       Other studies Reviewed: Additional studies/ records that were reviewed today include:   Cardiac cath:Marland Kitchen Dominance: Right   Left Main  Vessel was injected. Vessel is large. Vessel is angiographically normal. JL4 Catheter used.     Left Anterior Descending  The vessel exhibits minimal luminal irregularities.   . First Diagonal Branch   The vessel is angiographically normal.   . First Septal Branch   The vessel is moderate in size and is angiographically normal.   . Second Diagonal Branch   The vessel is small in size and is angiographically normal.   . Second Septal Branch   The vessel is small in size.   . Third Septal Branch   The vessel is small in size.     Left Circumflex  . Vessel is angiographically normal. The distal vessel continues as a small caliber vessel that gives off a small LPL branch.   . First Obtuse Marginal Branch   The vessel is small in size and is angiographically normal.   . Second Obtuse Marginal Branch   The vessel is moderate in size and is angiographically normal.   . Third Obtuse Marginal Branch   The vessel is angiographically normal.   . Lateral Third Obtuse Marginal Branch   The vessel is small in size.     Right Coronary Artery  Vessel was injected. Vessel is large. TIG 4.0 Catheter   . Prox RCA to Mid RCA lesion, 100% stenosed. diffuse with heavy thrombus eccentric ulcerative.   Marland Kitchen PCI: There is  no pre-interventional antegrade distal flow (TIMI 0). Pre-stent angioplasty was performed. A drug-eluting stent was placed. Minimum lumen area: 3.3 mm. The strut is apposed. Post-stent angioplasty was performed. Lesion length: 30 mm. Maximum pressure: 18 atm. The post-interventional distal flow is normal (TIMI 3). The intervention was successful. Distal emobolization to the distal tip of PDA After IV Angiomax (ACT>200 Sec) & Brilinta 180 mg PO CATH HEARTRAIL IKARI 54F IR1.0 ([id number]). WIRE HI TORQ BMW 190CM ([id number] HC). 3 pre-dilation inflations throughout the diseased segment. 1 brief inflation of the pre-dilation balloon to 4 Atm in the RPAV branch after initial lesion pre-dilation.  . Supplies used: BALLOON EMERGE MR 2.5X12; STENT PROMUS PREM MR 3.0X32; Pateros [id number]  . There is  no residual stenosis post intervention.     . Acute Marginal Branch   The vessel is small in size.   . Right Posterior Descending Artery   The vessel is moderate in size. RPDA filled by collaterals from Dist LAD.   Marland Kitchen RPDA lesion, 100% stenosed. discrete thrombotic.   . Inferior Septal   The vessel is small in size.   . Right Posterior Atrioventricular Branch   The vessel is moderate in size.      Wall Motion                 Left Heart    Left Ventricle The left ventricular size is normal. The left ventricular systolic function is normal. The left ventricular ejection fraction is 55-65% by visual estimate. There are no wall motion abnormalities in the left ventricle.   Mitral Valve There is no mitral valve stenosis and trivial (1+) mitral regurgitation.   Aortic Valve There is no aortic valve stenosis, and no aortic valve regurgitation.    Coronary Diagrams    Diagnostic Diagram           Post-Intervention Diagram            Implants    Name ID Temporary Type Supply   Ronna Polio PREM MR 3.0X32 - [id number] No Permanent Stent STENT PROMUS PREM MR 3.0X32     PACS Images    Show images for Cardiac catheterization     Link to Procedure Log    Procedure Log      Hemo Data       Most Recent Value   AO Systolic Pressure  010 mmHg   AO Diastolic Pressure  77 mmHg   AO Mean  89 mmHg   LV Systolic Pressure  272 mmHg   LV Diastolic Pressure  8 mmHg   LV EDP  11 mmHg   Arterial Occlusion Pressure Extended Systolic Pressure  536 mmHg   Arterial Occlusion Pressure Extended Diastolic Pressure  79 mmHg   Arterial Occlusion Pressure Extended Mean Pressure  92 mmHg   Left Ventricular Apex Extended Systolic Pressure  644 mmHg   Left Ventricular Apex Extended Diastolic Pressure  7 mmHg   Left Ventricular Apex Extended EDP Pressure     ECHO: - Left ventricle: The cavity size was normal. Wall thickness was normal. Systolic function was normal. The estimated ejection fraction was in the range of 55% to 60%. Wall motion was normal; there were no regional wall motion abnormalities. Left ventricular diastolic function parameters were normal.   ASSESSMENT AND PLAN:  1.  Hx NSTEMI this month with DES stent to RCA, promus premier.  Pt doing well, no pain, no SOB has returned to work does not wish to participate in cardiac rehab.   In Twilight study providing Brilinat and ASA.  Follow up with Dr. Radford Pax in 6-8 weeks.  2. CAD with stent as above.  Stable, continue BB   3. Hyperlipidemia continue statin, will recheck lipids and CMP in 4 weeks,    Current medicines are reviewed with the patient today.  The patient Has no concerns regarding medicines.  The following changes have been made:  See above Labs/ tests ordered today include:see above  Disposition:   FU:  see above  Lennie Muckle, NP  03/23/2015 4:37 PM    [facility] [place], [place], [place], [place] Phone: [phone]; Fax: [phone]  [phone]

## 2015-04-08 ENCOUNTER — Encounter: Payer: Self-pay | Admitting: *Deleted

## 2015-04-08 DIAGNOSIS — Z006 Encounter for examination for normal comparison and control in clinical research program: Secondary | ICD-10-CM

## 2015-04-08 NOTE — Progress Notes (Signed)
TWILIGHT Research study 1 month telephone follow up completed. Patient denies adverse or bleeding events. Patient 100% compliant with Brilinta and ASA. Questions encouraged and answered. 3 month Randomization visit scheduled for 06/04/15 @ 4 pm.

## 2015-04-20 ENCOUNTER — Other Ambulatory Visit (INDEPENDENT_AMBULATORY_CARE_PROVIDER_SITE_OTHER): Payer: BLUE CROSS/BLUE SHIELD | Admitting: *Deleted

## 2015-04-20 DIAGNOSIS — I214 Non-ST elevation (NSTEMI) myocardial infarction: Secondary | ICD-10-CM | POA: Diagnosis not present

## 2015-04-20 DIAGNOSIS — I10 Essential (primary) hypertension: Secondary | ICD-10-CM

## 2015-04-20 LAB — LIPID PANEL
CHOL/HDL RATIO: 2.6 ratio (ref <=5.0)
CHOLESTEROL: 82 mg/dL — AB (ref 125–200)
HDL: 31 mg/dL — AB (ref >=40)
LDL Cholesterol: 40 mg/dL (ref <130)
Triglycerides: 56 mg/dL (ref <150)
VLDL: 11 mg/dL (ref <30)

## 2015-04-20 LAB — COMPREHENSIVE METABOLIC PANEL
ALBUMIN: 3.8 g/dL (ref 3.6–5.1)
ALT: 35 U/L (ref 9–46)
AST: 25 U/L (ref 10–35)
Alkaline Phosphatase: 67 U/L (ref 40–115)
BUN: 11 mg/dL (ref 7–25)
CALCIUM: 9 mg/dL (ref 8.6–10.3)
CHLORIDE: 104 mmol/L (ref 98–110)
CO2: 27 mmol/L (ref 20–31)
Creat: 0.93 mg/dL (ref 0.70–1.33)
GLUCOSE: 101 mg/dL — AB (ref 65–99)
POTASSIUM: 4 mmol/L (ref 3.5–5.3)
Sodium: 136 mmol/L (ref 135–146)
Total Bilirubin: 0.4 mg/dL (ref 0.2–1.2)
Total Protein: 6.7 g/dL (ref 6.1–8.1)

## 2015-04-21 ENCOUNTER — Telehealth: Payer: Self-pay | Admitting: *Deleted

## 2015-04-21 NOTE — Telephone Encounter (Signed)
-----   Message from Leone BrandLaura R Ingold, NP sent at 04/20/2015 10:13 PM EST ----- Continue current meds. Lipids much improved.

## 2015-04-21 NOTE — Telephone Encounter (Signed)
Spoke to pt and advised him that his lipids were very much improved and that we will continue on the current medication at this time.  Pt verbalized understanding and appreciation.

## 2015-05-14 ENCOUNTER — Encounter: Payer: Self-pay | Admitting: Cardiology

## 2015-05-14 ENCOUNTER — Ambulatory Visit (INDEPENDENT_AMBULATORY_CARE_PROVIDER_SITE_OTHER): Payer: BLUE CROSS/BLUE SHIELD | Admitting: Cardiology

## 2015-05-14 VITALS — BP 122/74 | HR 60 | Ht 68.0 in | Wt 180.0 lb

## 2015-05-14 DIAGNOSIS — I2510 Atherosclerotic heart disease of native coronary artery without angina pectoris: Secondary | ICD-10-CM | POA: Diagnosis not present

## 2015-05-14 DIAGNOSIS — I214 Non-ST elevation (NSTEMI) myocardial infarction: Secondary | ICD-10-CM

## 2015-05-14 DIAGNOSIS — E785 Hyperlipidemia, unspecified: Secondary | ICD-10-CM | POA: Diagnosis not present

## 2015-05-14 DIAGNOSIS — I10 Essential (primary) hypertension: Secondary | ICD-10-CM

## 2015-05-14 NOTE — Progress Notes (Signed)
Cardiology Office Note   Date:  05/14/2015   ID:  Benjamin Hoffman, DOB June 14, 1955, MRN [id number]  PCP:  Cheral Bay, MD    No chief complaint on file.     History of Present Illness: Benjamin Hoffman is a 60 y.o. male who presents for followup of recent NSTEMI with cath showing 100% RCA stenosis s/p PCI with promus premier stent-DES.   EF was preserved at 55-60-%. He continues ASA, brilinta, lopressor 25, statin.  He has done well since discharge no chest pain and no SOB. He walks for exercise.  He denies any palpitations, dizziness, syncope, LE edema or claudication.     Past Medical History  Diagnosis Date  . NSTEMI (non-ST elevated myocardial infarction) (HCC) 03/2015  . S/P angioplasty with stent 03/2015    to RCA, with DES  . CAD (coronary artery disease), native coronary artery 03/2015  . Hyperlipidemia LDL goal <70     Past Surgical History  Procedure Laterality Date  . Appendectomy    . Cardiac catheterization N/A 03/06/2015    Procedure: Left Heart Cath and Coronary Angiography;  Surgeon: Marykay Lex, MD;  Location: [facility] INVASIVE CV LAB;  Service: Cardiovascular;  Laterality: N/A;  . Cardiac catheterization N/A 03/06/2015    Procedure: Coronary Stent Intervention;  Surgeon: Marykay Lex, MD;  Location: [facility] INVASIVE CV LAB;  Service: Cardiovascular;  Laterality: N/A;     Current Outpatient Prescriptions  Medication Sig Dispense Refill  . AMBULATORY NON FORMULARY MEDICATION Take 90 mg by mouth 2 (two) times daily. Medication Name: BRILINTA 90 mg BID (TWILIGHT Research study provided)    . AMBULATORY NON FORMULARY MEDICATION Take 81 mg by mouth daily. Medication Name: ASA 81 mg Daily (TWILIGHT Research Study Provided)    . atorvastatin (LIPITOR) 80 MG tablet Take 1 tablet (80 mg total) by mouth daily at 6 PM. 30 tablet 11  . finasteride (PROSCAR) 5 MG tablet Take 2.5 mg by mouth daily.    . metoprolol tartrate (LOPRESSOR) 25 MG tablet Take 1 tablet  (25 mg total) by mouth 2 (two) times daily. 60 tablet 11  . nitroGLYCERIN (NITROSTAT) 0.4 MG SL tablet Place 1 tablet (0.4 mg total) under the tongue every 5 (five) minutes x 3 doses as needed for chest pain. 25 tablet 12   No current facility-administered medications for this visit.    Allergies:   Penicillins    Social History:  The patient  reports that he quit smoking about 6 years ago. He does not have any smokeless tobacco history on file. He reports that he does not drink alcohol or use illicit drugs.   Family History:  The patient's family history includes CVA in his father; Diverticulitis in his brother; Heart attack (age of onset: 87) in his maternal grandfather; Heart disease in his maternal grandfather; Hypertension in his father.    ROS:  Please see the history of present illness.   Otherwise, review of systems are positive for none.   All other systems are reviewed and negative.    PHYSICAL EXAM: VS:  BP 122/74 mmHg  Pulse 60  Ht 5\' 8"  (1.727 m)  Wt 180 lb (81.647 kg)  BMI 27.38 kg/m2 , BMI Body mass index is 27.38 kg/(m^2). GEN: Well nourished, well developed, in no acute distress HEENT: normal Neck: no JVD, carotid bruits, or masses Cardiac: RRR; no murmurs, rubs, or gallops,no edema  Respiratory:  clear to auscultation bilaterally, normal work of breathing GI: soft, nontender, nondistended, + BS MS: no deformity or atrophy Skin: warm and dry, no rash Neuro:  Strength and sensation are intact Psych: euthymic mood, full affect   EKG:  EKG was ordered today showing NSR at 70bpm with no ST changes    Recent Labs: 03/05/2015: Magnesium 1.7 03/06/2015: TSH 0.588 03/07/2015: Hemoglobin 13.9; Platelets 171 04/20/2015: ALT 35; BUN 11; Creat 0.93; Potassium 4.0; Sodium 136    Lipid Panel    Component Value Date/Time   CHOL 82* 04/20/2015 0739   TRIG 56 04/20/2015 0739   HDL 31* 04/20/2015 0739   CHOLHDL 2.6 04/20/2015 0739   VLDL 11 04/20/2015 0739   LDLCALC  40 04/20/2015 0739      Wt Readings from Last 3 Encounters:  05/14/15 180 lb (81.647 kg)  03/23/15 177 lb (80.287 kg)  03/07/15 188 lb 0.8 oz (85.3 kg)    ASSESSMENT AND PLAN:  1. ASCAD with recent NSTEMI s/p DES stent to RCA, promus premier. Pt doing well, no pain or SOB.  [place] providing Brilinata and ASA. Continue BB and statin.  2.  S/P recent NSTEMI - see above  3. Hyperlipidemia continue statin.  LDL last month at goal at 40.    4.  HTN - controlled on current medical regimen   Current medicines are reviewed at length with the patient today.  The patient does not have concerns regarding medicines.  The following changes have been made:  no change  Labs/ tests ordered today: See above Assessment and Plan No orders of the defined types were placed in this encounter.     Disposition:   FU with me in 6 months  Signed, Quintella ReichertURNER,TRACI R, MD  05/14/2015 3:22 PM    [facility] [address], [place], [place]  [address] Phone: [phone]) [phone]; Fax: [phone]

## 2015-05-14 NOTE — Patient Instructions (Signed)

## 2015-06-04 ENCOUNTER — Other Ambulatory Visit: Payer: Self-pay | Admitting: *Deleted

## 2015-06-04 ENCOUNTER — Encounter: Payer: Self-pay | Admitting: *Deleted

## 2015-06-04 DIAGNOSIS — Z006 Encounter for examination for normal comparison and control in clinical research program: Secondary | ICD-10-CM

## 2015-06-04 MED ORDER — AMBULATORY NON FORMULARY MEDICATION: 81.0000 mg | Freq: Every day | Status: DC

## 2015-06-04 NOTE — Progress Notes (Signed)
TWILIGHT Research study 3 month Randomization visit completed. Patient denies any adverse or bleeding events. States compliant with study drug may have missed a pill here and there. Questions encouraged and answered. Follow up visit window schedule given to patient.

## 2015-07-09 ENCOUNTER — Encounter: Payer: Self-pay | Admitting: *Deleted

## 2015-07-09 DIAGNOSIS — Z006 Encounter for examination for normal comparison and control in clinical research program: Secondary | ICD-10-CM

## 2015-07-09 NOTE — Progress Notes (Signed)
TWILIGHT Research 4 month telephone call to patient. Pt. Denies any adverse or bleeding events states he has been compliant with study medication.

## 2015-11-23 ENCOUNTER — Encounter: Payer: Self-pay | Admitting: Cardiology

## 2015-12-10 ENCOUNTER — Ambulatory Visit (INDEPENDENT_AMBULATORY_CARE_PROVIDER_SITE_OTHER): Payer: BLUE CROSS/BLUE SHIELD | Admitting: Cardiology

## 2015-12-10 ENCOUNTER — Encounter: Payer: Self-pay | Admitting: Cardiology

## 2015-12-10 ENCOUNTER — Encounter: Payer: Self-pay | Admitting: *Deleted

## 2015-12-10 VITALS — BP 134/88 | HR 72 | Ht 68.0 in | Wt 176.4 lb

## 2015-12-10 DIAGNOSIS — I2510 Atherosclerotic heart disease of native coronary artery without angina pectoris: Secondary | ICD-10-CM

## 2015-12-10 DIAGNOSIS — E785 Hyperlipidemia, unspecified: Secondary | ICD-10-CM | POA: Diagnosis not present

## 2015-12-10 DIAGNOSIS — Z006 Encounter for examination for normal comparison and control in clinical research program: Secondary | ICD-10-CM

## 2015-12-10 NOTE — Patient Instructions (Signed)
Medication Instructions:  1) DECREASE BRILINTA to 60 mg 03/06/2016. You will receive a phone call as a reminder and to call in your new Rx.  Labwork: Your physician recommends that you return for FASTING lab work.  Testing/Procedures: None  Follow-Up: Your physician wants you to follow-up in: 6 months with Dr. Mayford Knifeurner. You will receive a reminder letter in the mail two months in advance. If you don't receive a letter, please call our office to schedule the follow-up appointment.   Any Other Special Instructions Will Be Listed Below (If Applicable).     If you need a refill on your cardiac medications before your next appointment, please call your pharmacy.

## 2015-12-10 NOTE — Progress Notes (Signed)
Cardiology Office Note    Date:  12/10/2015   ID:  Benjamin Hoffman, DOB 1955-06-20, MRN [id number]  PCP:  [name],[name], MD  Cardiologist:  Armanda Magicraci Judie Hollick, MD   Chief Complaint  Patient presents with  . Coronary Artery Disease  . Hyperlipidemia    History of Present Illness:  Benjamin BaileyMark Hoffman is a 60 y.o. male with history of ASCAD with remote NSTEMI with cath showing 100% RCA stenosis s/p PCI with promus premier stent-DES.  He has a history also of dyslipidemia.  EF was preserved at 55-60-% at time of cath. He is on DAPT with ASA and Brilinta.  He is doing well with no chest pain and no SOB. He walks for exercise.  He denies any palpitations, dizziness, syncope, LE edema or claudication.      Past Medical History:  Diagnosis Date  . CAD (coronary artery disease), native coronary artery 03/2015  . Hyperlipidemia LDL goal <70   . NSTEMI (non-ST elevated myocardial infarction) (HCC) 03/2015  . S/P angioplasty with stent 03/2015   to RCA, with DES    Past Surgical History:  Procedure Laterality Date  . APPENDECTOMY    . CARDIAC CATHETERIZATION N/A 03/06/2015   Procedure: Left Heart Cath and Coronary Angiography;  Surgeon: [name], MD;  [facility];  Service: Cardiovascular;  Laterality: N/A;  . CARDIAC CATHETERIZATION N/A 03/06/2015   Procedure: Coronary Stent Intervention;  Surgeon: [name], MD;  [facility];  Service: Cardiovascular;  Laterality: N/A;    Current Medications: Outpatient Medications Prior to Visit  Medication Sig Dispense Refill  . AMBULATORY NON FORMULARY MEDICATION Take 90 mg by mouth 2 (two) times daily. Medication Name: BRILINTA 90 mg BID (TWILIGHT Research study provided)    . AMBULATORY NON FORMULARY MEDICATION Take 81 mg by mouth daily. Medication Name: ASA or PLACEBO 81 mg daily (TWILIGHT Research study)    . atorvastatin (LIPITOR) 80 MG tablet Take 1 tablet (80 mg total) by mouth daily at 6 PM. 30 tablet 11  .  finasteride (PROSCAR) 5 MG tablet Take 2.5 mg by mouth daily.    . metoprolol tartrate (LOPRESSOR) 25 MG tablet Take 1 tablet (25 mg total) by mouth 2 (two) times daily. 60 tablet 11  . nitroGLYCERIN (NITROSTAT) 0.4 MG SL tablet Place 1 tablet (0.4 mg total) under the tongue every 5 (five) minutes x 3 doses as needed for chest pain. 25 tablet 12   No facility-administered medications prior to visit.      Allergies:   Penicillins   Social History   Social History  . Marital status: Married    Spouse name: N/A  . Number of children: N/A  . Years of education: N/A   Social History Main Topics  . Smoking status: Former Smoker    Packs/day: 1.00    Years: 30.00    Quit date: 03/04/2009  . Smokeless tobacco: Never Used  . Alcohol use No  . Drug use: No  . Sexual activity: Yes   Other Topics Concern  . None   Social History Narrative  . None     Family History:  The patient's family history includes CVA in his father; Diverticulitis in his brother; Heart attack (age of onset: 647) in his maternal grandfather; Heart disease in his maternal grandfather; Hypertension in his father.   ROS:   Please see the history of present illness.    ROS All other systems reviewed and are negative.  PHYSICAL EXAM:   VS:  BP 134/88   Pulse 72   Ht  (1.727 m)   Wt 176 lb 6.4 oz (80 kg)   SpO2 96%   BMI 26.82 kg/m    GEN: Well nourished, well developed, in no acute distress  HEENT: normal  Neck: no JVD, carotid bruits, or masses Cardiac: RRR; no murmurs, rubs, or gallops,no edema.  Intact distal pulses bilaterally.  Respiratory:  clear to auscultation bilaterally, normal work of breathing GI: soft, nontender, nondistended, + BS MS: no deformity or atrophy  Skin: warm and dry, no rash Neuro:  Alert and Oriented x 3, Strength and sensation are intact Psych: euthymic mood, full affect  Wt Readings from Last 3 Encounters:  12/10/15 176 lb 6.4 oz (80 kg)  05/14/15 180 lb (81.6 kg)    03/23/15 177 lb (80.3 kg)      Studies/Labs Reviewed:   EKG:  EKG is not ordered today.    Recent Labs: 03/05/2015: Magnesium 1.7 03/06/2015: TSH 0.588 03/07/2015: Hemoglobin 13.9; Platelets 171 04/20/2015: ALT 35; BUN 11; Creat 0.93; Potassium 4.0; Sodium 136   Lipid Panel    Component Value Date/Time   CHOL 82 (L) 04/20/2015 0739   TRIG 56 04/20/2015 0739   HDL 31 (L) 04/20/2015 0739   CHOLHDL 2.6 04/20/2015 0739   VLDL 11 04/20/2015 0739   LDLCALC 40 04/20/2015 0739    Additional studies/ records that were reviewed today include:  none    ASSESSMENT:    1. Coronary artery disease involving native coronary artery of native heart without angina pectoris   2. Hyperlipidemia with target LDL less than 70      PLAN:  In order of problems listed above:  1. ASCAD with remote NSTEMI due to occluded RCA s/p PCI with DES. He has not had any anginal CP.  Continue ASA/Brilinta/statin and BB.   2. Hyperlipidemia - LDL goal < 70.  Continue statin.  Check FLP and ALT.     Medication Adjustments/Labs and Tests Ordered: Current medicines are reviewed at length with the patient today.  Concerns regarding medicines are outlined above.  Medication changes, Labs and Tests ordered today are listed in the Patient Instructions below.  There are no Patient Instructions on file for this visit.   Signed, Armanda Magic, MD  12/10/2015 3:41 PM    [facility] [address] [place], [place], [place]  [address] Phone: [phone]; Fax: [phone]

## 2015-12-11 NOTE — Progress Notes (Signed)
TWILIGHT Research 9 month visit completed with Dr. Mayford Knifeurner Traci Sermon(H. Pruitt, RN present). Patient has been 100% compliant with Brilinta and ASA/PLACEBO based on returned pill count. Dispensed bottle number's [id number]; [phone]. Next research visit window is 29/DEC/17----27/FEB/18. At that appointment he will be returning Brilinta and study drug, further antiplatelet therapy will be at the discretion of his cardiologist.

## 2015-12-18 ENCOUNTER — Other Ambulatory Visit: Payer: BLUE CROSS/BLUE SHIELD | Admitting: *Deleted

## 2015-12-18 DIAGNOSIS — I2510 Atherosclerotic heart disease of native coronary artery without angina pectoris: Secondary | ICD-10-CM

## 2015-12-18 DIAGNOSIS — E785 Hyperlipidemia, unspecified: Secondary | ICD-10-CM

## 2015-12-18 LAB — HEPATIC FUNCTION PANEL
ALBUMIN: 4.1 g/dL (ref 3.6–5.1)
ALT: 43 U/L (ref 9–46)
AST: 23 U/L (ref 10–35)
Alkaline Phosphatase: 67 U/L (ref 40–115)
BILIRUBIN TOTAL: 0.9 mg/dL (ref 0.2–1.2)
Bilirubin, Direct: 0.2 mg/dL (ref <=0.2)
Indirect Bilirubin: 0.7 mg/dL (ref 0.2–1.2)
TOTAL PROTEIN: 6.9 g/dL (ref 6.1–8.1)

## 2015-12-18 LAB — LIPID PANEL
CHOLESTEROL: 104 mg/dL — AB (ref 125–200)
HDL: 43 mg/dL (ref >=40)
LDL Cholesterol: 47 mg/dL (ref <130)
TRIGLYCERIDES: 69 mg/dL (ref <150)
Total CHOL/HDL Ratio: 2.4 Ratio (ref <=5.0)
VLDL: 14 mg/dL (ref <30)

## 2016-01-18 ENCOUNTER — Other Ambulatory Visit: Payer: Self-pay | Admitting: *Deleted

## 2016-01-18 MED ORDER — ATORVASTATIN CALCIUM 80 MG PO TABS: 80.0000 mg | ORAL_TABLET | Freq: Every day | ORAL | 11 refills | Status: DC

## 2016-01-18 NOTE — Telephone Encounter (Signed)
Rx has been sent to the pharmacy electronically. ° °

## 2016-03-04 ENCOUNTER — Other Ambulatory Visit: Payer: Self-pay | Admitting: Cardiology

## 2016-03-04 MED ORDER — METOPROLOL TARTRATE 25 MG PO TABS: 25.0000 mg | ORAL_TABLET | Freq: Two times a day (BID) | ORAL | 9 refills | Status: DC

## 2016-03-09 ENCOUNTER — Telehealth: Payer: Self-pay | Admitting: Cardiology

## 2016-03-09 ENCOUNTER — Encounter: Payer: Self-pay | Admitting: *Deleted

## 2016-03-09 DIAGNOSIS — Z006 Encounter for examination for normal comparison and control in clinical research program: Secondary | ICD-10-CM

## 2016-03-09 NOTE — Progress Notes (Signed)
Received staff message regarding Brilinta and patient had question about script. I called patient and to investigate his questions about brilinta. Apparently when he saw Dr. Mayford Knifeurner 10/aug/2017 he thought she wanted him to start Brilinta 60 mg in December. Patient has one more study required visit due no later than 27/feb/2018. At that visit he will no longer be provided ASA/placebo or Brilinta and further antiplatelet therapy will be decided by Dr. Mayford Knifeurner and if needed a script will be sent to pharmacy. Patient states he understands and has enough brilinta & ASA/placebo to last until that appointment. Questions encouraged and answered. Encouraged patient to call research office if he had any questions.

## 2016-03-09 NOTE — Telephone Encounter (Signed)
Medication is not on pt's med list, but pt stated that he was in a research study for Brilinta. Please advise

## 2016-03-09 NOTE — Telephone Encounter (Addendum)
Pt calling regarding needing a refill of a change in Brilinta from 90 mg BID to 60 mg BID-uses [facility] [address] [place]

## 2016-05-20 DIAGNOSIS — Z1211 Encounter for screening for malignant neoplasm of colon: Secondary | ICD-10-CM | POA: Diagnosis not present

## 2016-05-20 DIAGNOSIS — K648 Other hemorrhoids: Secondary | ICD-10-CM | POA: Diagnosis not present

## 2016-05-20 DIAGNOSIS — Z800 Family history of malignant neoplasm of digestive organs: Secondary | ICD-10-CM | POA: Diagnosis not present

## 2016-05-20 DIAGNOSIS — K5730 Diverticulosis of large intestine without perforation or abscess without bleeding: Secondary | ICD-10-CM | POA: Diagnosis not present

## 2016-05-20 DIAGNOSIS — D126 Benign neoplasm of colon, unspecified: Secondary | ICD-10-CM | POA: Diagnosis not present

## 2016-05-20 DIAGNOSIS — Z86010 Personal history of colonic polyps: Secondary | ICD-10-CM | POA: Diagnosis not present

## 2016-05-20 DIAGNOSIS — D123 Benign neoplasm of transverse colon: Secondary | ICD-10-CM | POA: Diagnosis not present

## 2016-06-02 DIAGNOSIS — H9192 Unspecified hearing loss, left ear: Secondary | ICD-10-CM | POA: Diagnosis not present

## 2016-06-09 ENCOUNTER — Telehealth: Payer: Self-pay

## 2016-06-09 MED ORDER — CLOPIDOGREL BISULFATE 75 MG PO TABS
75.0000 mg | ORAL_TABLET | Freq: Every day | ORAL | 11 refills | Status: DC
Start: 2016-06-09 — End: 2017-01-05

## 2016-06-09 NOTE — Telephone Encounter (Signed)
-----   Message from Orrin Brighamammy W Hedrick, RN sent at 06/07/2016  9:17 AM EST ----- Regarding: FW: End of Treatment TWILIGHT research study Contact: [phone] Hi Samara DeistKathryn,    Could you make sure Mr. Katrinka BlazingSmith has Plavix sent to his pharmacy by 06/20/16?  Thank you Tammy ----- Message ----- From: Quintella Reichertraci R Turner, MD Sent: 06/02/2016   7:47 PM To: Orrin Brighamammy W Hedrick, RN Subject: RE: End of Treatment TWILIGHT research study   Please change patient to ASA 81mg  daily and Plavix 75mg  daily  Armanda Magicraci Turner, MD ----- Message ----- From: Orrin Brighamammy W Hedrick, RN Sent: 06/02/2016   9:10 AM To: Quintella Reichertraci R Turner, MD Subject: End of Treatment TWILIGHT research study       Dr. Mayford Knifeurner,    Mr. Katrinka BlazingSmith has a research appointment 06/20/16 @ 3 pm.  At this appointment he will no longer receive TWILIGHT Research provided ASA/Placebo or Brilinta.   Further Antiplatelet therapy is at your discretion (including ASA) and a prescription will need to be sent to their pharmacy.  Thank you Tammy

## 2016-06-09 NOTE — Telephone Encounter (Signed)
Plavix 75 mg daily called in to patient's pharmacy. Informed patient that Plavix 75 mg daily was called in. He understands NOT to start the medication until instructed by Research.   To Research to discuss with patient 2/19.  Patient will start Plavix 75 mg daily and ASA 81 mg daily.

## 2016-06-20 ENCOUNTER — Other Ambulatory Visit: Payer: Self-pay | Admitting: *Deleted

## 2016-06-20 ENCOUNTER — Encounter: Payer: Self-pay | Admitting: *Deleted

## 2016-06-20 DIAGNOSIS — Z006 Encounter for examination for normal comparison and control in clinical research program: Secondary | ICD-10-CM

## 2016-06-20 MED ORDER — ASPIRIN EC 81 MG PO TBEC: 81.0000 mg | DELAYED_RELEASE_TABLET | Freq: Every day | ORAL | 3 refills | Status: AC

## 2016-06-20 NOTE — Progress Notes (Signed)
TWILIGHT research study month 15 visit (end of treatment) completed. Patient denies any bleeding events or other adverse events. Ne states he has been compliant with medication, after pill count patient was 100 % compliant with ASA/Placebo and 96 % compliant with Brilinta. Patient has been informed to start plavix 75 mg daily and Aspirin 81 mg daily. Next research required appointment is a phone call before 12/MAY/2018. Questions encouraged and answered. I thanked the patient for participating in the study and he was much appreciative of the study provided medication.

## 2016-06-27 DIAGNOSIS — H6122 Impacted cerumen, left ear: Secondary | ICD-10-CM | POA: Diagnosis not present

## 2016-06-27 DIAGNOSIS — H9312 Tinnitus, left ear: Secondary | ICD-10-CM | POA: Diagnosis not present

## 2016-06-27 DIAGNOSIS — H903 Sensorineural hearing loss, bilateral: Secondary | ICD-10-CM | POA: Diagnosis not present

## 2016-06-27 DIAGNOSIS — Z6828 Body mass index (BMI) 28.0-28.9, adult: Secondary | ICD-10-CM | POA: Diagnosis not present

## 2016-09-05 ENCOUNTER — Other Ambulatory Visit: Payer: Self-pay

## 2016-09-05 MED ORDER — NITROGLYCERIN 0.4 MG SL SUBL: 0.4000 mg | SUBLINGUAL_TABLET | SUBLINGUAL | 4 refills | Status: DC | PRN

## 2016-09-06 ENCOUNTER — Encounter: Payer: Self-pay | Admitting: *Deleted

## 2016-09-06 DIAGNOSIS — Z006 Encounter for examination for normal comparison and control in clinical research program: Secondary | ICD-10-CM

## 2016-09-06 NOTE — Progress Notes (Signed)
TWILIGHT Research study month 1 telephone follow up completed. Patient denies any adverse events and continues on Plavix and ASA. I thanked him for participating in the study.

## 2016-10-21 DIAGNOSIS — Z6826 Body mass index (BMI) 26.0-26.9, adult: Secondary | ICD-10-CM | POA: Diagnosis not present

## 2016-10-21 DIAGNOSIS — M25551 Pain in right hip: Secondary | ICD-10-CM | POA: Diagnosis not present

## 2016-10-21 DIAGNOSIS — S76311A Strain of muscle, fascia and tendon of the posterior muscle group at thigh level, right thigh, initial encounter: Secondary | ICD-10-CM | POA: Diagnosis not present

## 2016-11-28 ENCOUNTER — Other Ambulatory Visit: Payer: Self-pay | Admitting: Cardiology

## 2016-11-28 MED ORDER — METOPROLOL TARTRATE 25 MG PO TABS: 25.0000 mg | ORAL_TABLET | Freq: Two times a day (BID) | ORAL | 0 refills | Status: DC

## 2016-11-28 NOTE — Telephone Encounter (Signed)
Pt's medication was sent to pt's pharmacy as requested. Confirmation received.  °

## 2016-12-15 ENCOUNTER — Telehealth: Payer: Self-pay | Admitting: Cardiology

## 2016-12-15 ENCOUNTER — Other Ambulatory Visit: Payer: Self-pay

## 2016-12-15 MED ORDER — METOPROLOL TARTRATE 25 MG PO TABS: 25.0000 mg | ORAL_TABLET | Freq: Two times a day (BID) | ORAL | 0 refills | Status: DC

## 2016-12-15 NOTE — Telephone Encounter (Signed)
New message       *STAT* If patient is at the pharmacy, call can be transferred to refill team.   1. Which medications need to be refilled? (please list name of each medication and dose if known) metoprolol 2. Which pharmacy/location (including street and city if local pharmacy) is medication to be sent to? [facility] in [place]  3. Do they need a 30 day or 90 day supply?  30 day-------pt is out of medication

## 2017-01-05 ENCOUNTER — Encounter: Payer: Self-pay | Admitting: Cardiology

## 2017-01-05 ENCOUNTER — Ambulatory Visit (INDEPENDENT_AMBULATORY_CARE_PROVIDER_SITE_OTHER): Payer: BLUE CROSS/BLUE SHIELD | Admitting: Cardiology

## 2017-01-05 VITALS — BP 114/80 | HR 71 | Ht 68.0 in | Wt 178.8 lb

## 2017-01-05 DIAGNOSIS — E784 Other hyperlipidemia: Secondary | ICD-10-CM | POA: Diagnosis not present

## 2017-01-05 DIAGNOSIS — I2510 Atherosclerotic heart disease of native coronary artery without angina pectoris: Secondary | ICD-10-CM | POA: Diagnosis not present

## 2017-01-05 DIAGNOSIS — E7849 Other hyperlipidemia: Secondary | ICD-10-CM

## 2017-01-05 MED ORDER — CLOPIDOGREL BISULFATE 75 MG PO TABS: 75.0000 mg | ORAL_TABLET | Freq: Every day | ORAL | 3 refills | Status: DC

## 2017-01-05 MED ORDER — METOPROLOL TARTRATE 25 MG PO TABS
25.0000 mg | ORAL_TABLET | Freq: Two times a day (BID) | ORAL | 3 refills | Status: DC
Start: 2017-01-05 — End: 2017-12-22

## 2017-01-05 NOTE — Progress Notes (Signed)
01/05/2017 Benjamin BaileyMark Hoffman   1955/09/19  [id number]  Primary Physician Benjamin BayHawks, Benjamin N, MD Primary Cardiologist: Dr. Mayford Knifeurner    Reason for Visit/CC: Routien F/u for CAD   HPI:  61 y.o. male with history of ASCAD with NSTEMI in 2016 with cath showing 100% RCA stenosis s/p PCI with promus premier stent-DES.  EF was preserved at 55-60-% at time of cath. He was on DAPT with ASA and Brilinta but, after 12 months of therapy, Brilinta was stopped and Plavix added. He has a history also of dyslipidemia. He is on statin therapy with Lipitor. Last lipid panel 12/2015 showed LDL to be at goal at 47 mg/dL.   He presents to clinic today for f/u for CAD. He is also in need of refills of his metoprolol and Plavix. EKG today shows NSR and RBBB, unchanged from previous.  HR 71 bpm. He has done well. No recurrent CP or dyspnea. He walks on the treadmill for exercise. No exertional symptoms. He reports full med compliance. He has not required use of SL NTG.   Current Meds  Medication Sig  . aspirin EC 81 MG tablet Take 1 tablet (81 mg total) by mouth daily.  Marland Kitchen. atorvastatin (LIPITOR) 80 MG tablet Take 1 tablet (80 mg total) by mouth daily at 6 PM.  . clopidogrel (PLAVIX) 75 MG tablet Take 1 tablet (75 mg total) by mouth daily.  . finasteride (PROSCAR) 5 MG tablet Take 2.5 mg by mouth daily.  . metoprolol tartrate (LOPRESSOR) 25 MG tablet Take 1 tablet (25 mg total) by mouth 2 (two) times daily. Please keep 01/05/17 appt for future refills  . nitroGLYCERIN (NITROSTAT) 0.4 MG SL tablet Place 1 tablet (0.4 mg total) under the tongue every 5 (five) minutes x 3 doses as needed for chest pain.   Allergies  Allergen Reactions  . Penicillins Hives, Swelling and Rash   Past Medical History:  Diagnosis Date  . CAD (coronary artery disease), native coronary artery 03/2015  . Hyperlipidemia LDL goal <70   . NSTEMI (non-ST elevated myocardial infarction) (HCC) 03/2015  . S/P angioplasty with stent 03/2015   to RCA, with DES    Family History  Problem Relation Age of Onset  . CVA Father   . Hypertension Father   . Diverticulitis Brother   . Heart attack Maternal Grandfather 47       died of MI  . Heart disease Maternal Grandfather    Past Surgical History:  Procedure Laterality Date  . APPENDECTOMY    . CARDIAC CATHETERIZATION N/A 03/06/2015   Procedure: Left Heart Cath and Coronary Angiography;  Surgeon: [name], MD;  [facility];  Service: Cardiovascular;  Laterality: N/A;  . CARDIAC CATHETERIZATION N/A 03/06/2015   Procedure: Coronary Stent Intervention;  Surgeon: [name], MD;  [facility];  Service: Cardiovascular;  Laterality: N/A;   Social History   Social History  . Marital status: Married    Spouse name: N/A  . Number of children: N/A  . Years of education: N/A   Occupational History  . Not on file.   Social History Main Topics  . Smoking status: Former Smoker    Packs/day: 1.00    Years: 30.00    Quit date: 03/04/2009  . Smokeless tobacco: Never Used  . Alcohol use No  . Drug use: No  . Sexual activity: Yes   Other Topics Concern  . Not on file   Social History Narrative  .  No narrative on file     Review of Systems: General: negative for chills, fever, night sweats or weight changes.  Cardiovascular: negative for chest pain, dyspnea on exertion, edema, orthopnea, palpitations, paroxysmal nocturnal dyspnea or shortness of breath Dermatological: negative for rash Respiratory: negative for cough or wheezing Urologic: negative for hematuria Abdominal: negative for nausea, vomiting, diarrhea, bright red blood per rectum, melena, or hematemesis Neurologic: negative for visual changes, syncope, or dizziness All other systems reviewed and are otherwise negative except as noted above.   Physical Exam:  Blood pressure 114/80, pulse 71, height  (1.727 m), weight 178 lb 12.8 oz (81.1 kg).  General appearance: alert, cooperative  and no distress Neck: no carotid bruit and no JVD Lungs: clear to auscultation bilaterally Heart: regular rate and rhythm, S1, S2 normal, no murmur, click, rub or gallop Extremities: extremities normal, atraumatic, no cyanosis or edema Pulses: 2+ and symmetric Skin: Skin color, texture, turgor normal. No rashes or lesions Neurologic: Grossly normal  EKG NSR with chronic RBBB -- personally reviewed   ASSESSMENT AND PLAN:   1. CAD: H/o MI in 2016 with PCI to RCA. Normal LVEF. He is stable w/o anginal symptoms. Continue ASA, Plavix, BB and statin.  2. Lipids: lipid panel in 2017 showed controlled LDL at goal at 49 mg/dL. He is due for repeat lipid panel and HFTs. Pt would like to get done at PCP office. Pt given Rx and told to have labs faxed to our office for review.    Follow-Up in 1 year with Dr. Loa Hoffman, [facility] 01/05/2017 4:11 PM

## 2017-01-05 NOTE — Patient Instructions (Signed)
Medication Instructions: Your physician recommends that you continue on your current medications as directed. Please refer to the Current Medication list given to you today.  Labwork: Your physician recommends that you have lab work drawn at your PCP: Fasting Hepatic and Liver function panel - paper RX given to you. Please have your PCP fax results to our office at [phone] to the attention of Robbie LisBrittainy Simmons, PA-C   Procedures/Testing: None Ordered  Follow-Up: Your physician wants you to follow-up in: 1 YEAR with Dr. Mayford Knifeurner. You will receive a reminder letter in the mail two months in advance. If you don't receive a letter, please call our office to schedule the follow-up appointment.  Any Additional Special Instructions Will Be Listed Below (If Applicable).     If you need a refill on your cardiac medications before your next appointment, please call your pharmacy.

## 2017-01-13 ENCOUNTER — Other Ambulatory Visit: Payer: Self-pay | Admitting: Cardiology

## 2017-01-18 ENCOUNTER — Other Ambulatory Visit: Payer: Self-pay | Admitting: *Deleted

## 2017-01-18 ENCOUNTER — Telehealth: Payer: Self-pay | Admitting: Cardiology

## 2017-01-18 DIAGNOSIS — Z79899 Other long term (current) drug therapy: Secondary | ICD-10-CM

## 2017-01-18 NOTE — Telephone Encounter (Signed)
New message   Pt called stating at his last visit [place] gave him a prescription to have labs drawn in [facility]. He now wants to come to our office. He said his prescription is for fasting lipid and hepatic panel, can these be put into Epic so we can schedule pt?

## 2017-01-26 ENCOUNTER — Other Ambulatory Visit: Payer: BLUE CROSS/BLUE SHIELD | Admitting: *Deleted

## 2017-01-26 DIAGNOSIS — Z79899 Other long term (current) drug therapy: Secondary | ICD-10-CM | POA: Diagnosis not present

## 2017-01-26 LAB — LIPID PANEL
CHOLESTEROL TOTAL: 97 mg/dL — AB (ref 100–199)
Chol/HDL Ratio: 2.6 ratio (ref 0.0–5.0)
HDL: 37 mg/dL — AB (ref >39)
LDL Calculated: 45 mg/dL (ref 0–99)
Triglycerides: 74 mg/dL (ref 0–149)
VLDL CHOLESTEROL CAL: 15 mg/dL (ref 5–40)

## 2017-01-26 LAB — HEPATIC FUNCTION PANEL
ALT: 33 IU/L (ref 0–44)
AST: 25 IU/L (ref 0–40)
Albumin: 4.2 g/dL (ref 3.5–5.5)
Alkaline Phosphatase: 69 IU/L (ref 39–117)
BILIRUBIN TOTAL: 0.6 mg/dL (ref 0.0–1.2)
BILIRUBIN, DIRECT: 0.19 mg/dL (ref 0.00–0.40)
TOTAL PROTEIN: 6.4 g/dL (ref 6.0–8.5)

## 2017-02-01 ENCOUNTER — Telehealth: Payer: Self-pay | Admitting: *Deleted

## 2017-02-01 NOTE — Telephone Encounter (Signed)
Patient informed. 

## 2017-02-01 NOTE — Telephone Encounter (Signed)
-----   Message from Allayne Butcher, [place] sent at 02/01/2017  3:20 PM EDT ----- LDL is at goal. Liver function is ok. Continue cholesterol meds as directed. No changes.

## 2017-05-23 ENCOUNTER — Other Ambulatory Visit: Payer: Self-pay

## 2017-05-23 ENCOUNTER — Encounter (HOSPITAL_BASED_OUTPATIENT_CLINIC_OR_DEPARTMENT_OTHER): Payer: Self-pay

## 2017-05-23 ENCOUNTER — Emergency Department (HOSPITAL_BASED_OUTPATIENT_CLINIC_OR_DEPARTMENT_OTHER)
Admission: EM | Admit: 2017-05-23 | Discharge: 2017-05-23 | Disposition: A | Discharge status: Home / Self Care | Payer: BLUE CROSS/BLUE SHIELD | Attending: Emergency Medicine | Admitting: Emergency Medicine

## 2017-05-23 DIAGNOSIS — I951 Orthostatic hypotension: Secondary | ICD-10-CM | POA: Diagnosis not present

## 2017-05-23 DIAGNOSIS — R42 Dizziness and giddiness: Secondary | ICD-10-CM | POA: Diagnosis not present

## 2017-05-23 DIAGNOSIS — I2510 Atherosclerotic heart disease of native coronary artery without angina pectoris: Secondary | ICD-10-CM | POA: Insufficient documentation

## 2017-05-23 DIAGNOSIS — I252 Old myocardial infarction: Secondary | ICD-10-CM | POA: Diagnosis not present

## 2017-05-23 DIAGNOSIS — Z87891 Personal history of nicotine dependence: Secondary | ICD-10-CM | POA: Diagnosis not present

## 2017-05-23 DIAGNOSIS — I950 Idiopathic hypotension: Secondary | ICD-10-CM | POA: Diagnosis not present

## 2017-05-23 DIAGNOSIS — Z7902 Long term (current) use of antithrombotics/antiplatelets: Secondary | ICD-10-CM | POA: Insufficient documentation

## 2017-05-23 DIAGNOSIS — Z79899 Other long term (current) drug therapy: Secondary | ICD-10-CM | POA: Insufficient documentation

## 2017-05-23 DIAGNOSIS — Z7982 Long term (current) use of aspirin: Secondary | ICD-10-CM | POA: Insufficient documentation

## 2017-05-23 LAB — CBC WITH DIFFERENTIAL/PLATELET
Basophils Absolute: 0 10*3/uL (ref 0.0–0.1)
Basophils Relative: 0 %
EOS ABS: 0.1 10*3/uL (ref 0.0–0.7)
EOS PCT: 1 %
HCT: 43 % (ref 39.0–52.0)
Hemoglobin: 15.1 g/dL (ref 13.0–17.0)
LYMPHS ABS: 1.8 10*3/uL (ref 0.7–4.0)
LYMPHS PCT: 29 %
MCH: 30.6 pg (ref 26.0–34.0)
MCHC: 35.1 g/dL (ref 30.0–36.0)
MCV: 87 fL (ref 78.0–100.0)
MONO ABS: 0.5 10*3/uL (ref 0.1–1.0)
Monocytes Relative: 8 %
Neutro Abs: 3.7 10*3/uL (ref 1.7–7.7)
Neutrophils Relative %: 62 %
PLATELETS: 166 10*3/uL (ref 150–400)
RBC: 4.94 MIL/uL (ref 4.22–5.81)
RDW: 12.4 % (ref 11.5–15.5)
WBC: 6.2 10*3/uL (ref 4.0–10.5)

## 2017-05-23 LAB — COMPREHENSIVE METABOLIC PANEL
ALT: 30 U/L (ref 17–63)
ANION GAP: 10 (ref 5–15)
AST: 23 U/L (ref 15–41)
Albumin: 4.2 g/dL (ref 3.5–5.0)
Alkaline Phosphatase: 70 U/L (ref 38–126)
BUN: 10 mg/dL (ref 6–20)
CHLORIDE: 104 mmol/L (ref 101–111)
CO2: 24 mmol/L (ref 22–32)
CREATININE: 0.9 mg/dL (ref 0.61–1.24)
Calcium: 9.1 mg/dL (ref 8.9–10.3)
Glucose, Bld: 101 mg/dL — ABNORMAL HIGH (ref 65–99)
POTASSIUM: 3.8 mmol/L (ref 3.5–5.1)
SODIUM: 138 mmol/L (ref 135–145)
Total Bilirubin: 0.7 mg/dL (ref 0.3–1.2)
Total Protein: 7.4 g/dL (ref 6.5–8.1)

## 2017-05-23 LAB — TROPONIN I

## 2017-05-23 MED ORDER — SODIUM CHLORIDE 0.9 % IV BOLUS (SEPSIS)
500.0000 mL | Freq: Once | INTRAVENOUS | Status: AC
  Administered 2017-05-23: 500 mL via INTRAVENOUS

## 2017-05-23 NOTE — ED Notes (Signed)
Pt verbalizes understanding of d/c instructions and denies any further needs at this time. 

## 2017-05-23 NOTE — ED Triage Notes (Signed)
C/o intermittent dizziness, nausea x 1 week-denies pain-NAD-steady gait

## 2017-05-23 NOTE — Discharge Instructions (Signed)
Stop Proscar. Continue Lopressor. Increase fluids

## 2017-05-23 NOTE — ED Notes (Signed)
Called lab to add on troponin  

## 2017-05-23 NOTE — ED Provider Notes (Signed)
[facility] EMERGENCY DEPARTMENT Provider Note   CSN: [id number] Arrival date & time: 05/23/17  1331     History   Chief Complaint Chief Complaint  Patient presents with  . Dizziness    HPI Benjamin Hoffman is a 62 y.o. male.  Chief complaint is I get dizzy when I stand up.  HPI 62 year old male.  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  Follow-up echocardiogram showed no wall motion abnormalities and preserved LV function.  He has done quite well since then.  He continues on twice daily Lopressor.  In the last year and a half he has had a Proscar for hair growth.  Has been noticing for a few weeks that when he stands he feels lightheaded.  States his last just a few seconds.  He had an episode about 1 week ago and again yesterday where he felt some slight nausea for "just a second or 2".  No symptoms today.  He states he presents here because his wife made him come in.  No chest pain.  No shortness of breath.  No other changes in his baseline health.  No nausea vomiting.  He is hydrating well.  Past Medical History:  Diagnosis Date  . CAD (coronary artery disease), native coronary artery 03/2015  . Hyperlipidemia LDL goal <70   . NSTEMI (non-ST elevated myocardial infarction) (HCC) 03/2015  . S/P angioplasty with stent 03/2015   to RCA, with DES    Patient Active Problem List   Diagnosis Date Noted  . CAD (coronary artery disease), native coronary artery 05/14/2015  . Hyperlipidemia with target LDL less than 70 05/14/2015  . NSTEMI (non-ST elevated myocardial infarction) (HCC) 03/05/2015  . Chronic obstructive pulmonary disease (HCC) 10/25/2013  . Idiopathic thrombocytopenic purpura (HCC) 10/25/2013  . Cannot sleep 10/25/2013  . Excessive urination at night 10/25/2013    Past Surgical History:  Procedure Laterality Date  . APPENDECTOMY    . CARDIAC CATHETERIZATION N/A 03/06/2015   Procedure: Left Heart Cath and Coronary Angiography;   Surgeon: Marykay Lex, MD;  Location: [facility] INVASIVE CV LAB;  Service: Cardiovascular;  Laterality: N/A;  . CARDIAC CATHETERIZATION N/A 03/06/2015   Procedure: Coronary Stent Intervention;  Surgeon: Marykay Lex, MD;  Location: [facility] INVASIVE CV LAB;  Service: Cardiovascular;  Laterality: N/A;       Home Medications    Prior to Admission medications   Medication Sig Start Date End Date Taking? Authorizing Provider  aspirin EC 81 MG tablet Take 1 tablet (81 mg total) by mouth daily. 06/20/16   Kathleene Hazel, MD  atorvastatin (LIPITOR) 80 MG tablet Take 1 tablet (80 mg total) by mouth daily at 6 PM. 01/13/17   Allayne Butcher, PA-C  clopidogrel (PLAVIX) 75 MG tablet Take 1 tablet (75 mg total) by mouth daily. 01/05/17 12/31/17  Robbie Lis M, PA-C  finasteride (PROSCAR) 5 MG tablet Take 2.5 mg by mouth daily. 03/20/15   [provider]  metoprolol tartrate (LOPRESSOR) 25 MG tablet Take 1 tablet (25 mg total) by mouth 2 (two) times daily. Please keep 01/05/17 appt for future refills 01/05/17   Robbie Lis M, PA-C  nitroGLYCERIN (NITROSTAT) 0.4 MG SL tablet Place 1 tablet (0.4 mg total) under the tongue every 5 (five) minutes x 3 doses as needed for chest pain. 09/05/16   Quintella Reichert, MD    Family History Family History  Problem Relation Age of Onset  . CVA Father   .  Hypertension Father   . Diverticulitis Brother   . Heart attack Maternal Grandfather 47       died of MI  . Heart disease Maternal Grandfather     Social History Social History   Tobacco Use  . Smoking status: Former Smoker    Packs/day: 1.00    Years: 30.00    Pack years: 30.00    Last attempt to quit: 03/04/2009    Years since quitting: 8.2  . Smokeless tobacco: Never Used  Substance Use Topics  . Alcohol use: No  . Drug use: No     Allergies   Penicillins   Review of Systems Review of Systems  Constitutional: Negative for appetite change, chills, diaphoresis, fatigue and  fever.  HENT: Negative for mouth sores, sore throat and trouble swallowing.   Eyes: Negative for visual disturbance.  Respiratory: Negative for cough, chest tightness, shortness of breath and wheezing.   Cardiovascular: Negative for chest pain.  Gastrointestinal: Negative for abdominal distention, abdominal pain, diarrhea, nausea and vomiting.  Endocrine: Negative for polydipsia, polyphagia and polyuria.  Genitourinary: Negative for dysuria, frequency and hematuria.  Musculoskeletal: Negative for gait problem.  Skin: Negative for color change, pallor and rash.  Neurological: Positive for dizziness. Negative for syncope, light-headedness and headaches.  Hematological: Does not bruise/bleed easily.  Psychiatric/Behavioral: Negative for behavioral problems and confusion.     Physical Exam Updated Vital Signs BP (!) 142/90 (BP Location: Left Arm)   Pulse 64   Temp 97.9 F (36.6 C) (Oral)   Resp 16   Ht 5\' 8"  (1.727 m)   Wt 81.2 kg (179 lb)   SpO2 100%   BMI 27.22 kg/m   Physical Exam  Constitutional: He is oriented to person, place, and time. He appears well-developed and well-nourished. No distress.  HENT:  Head: Normocephalic.  No nystagmus.  Does not describe vertigo.  Conjunctive are not pale.  Eyes: Conjunctivae are normal. Pupils are equal, round, and reactive to light. No scleral icterus.  Neck: Normal range of motion. Neck supple. No thyromegaly present.  Cardiovascular: Normal rate and regular rhythm. Exam reveals no gallop and no friction rub.  No murmur heard. Regular.  Sinus rhythm on monitor.  Pulmonary/Chest: Effort normal and breath sounds normal. No respiratory distress. He has no wheezes. He has no rales.  Abdominal: Soft. Bowel sounds are normal. He exhibits no distension. There is no tenderness. There is no rebound.  Musculoskeletal: Normal range of motion.  Neurological: He is alert and oriented to person, place, and time.  Skin: Skin is warm and dry. No  rash noted.  Psychiatric: He has a normal mood and affect. His behavior is normal.     ED Treatments / Results  Labs (all labs ordered are listed, but only abnormal results are displayed) Labs Reviewed  COMPREHENSIVE METABOLIC PANEL - Abnormal; Notable for the following components:      Result Value   Glucose, Bld 101 (*)    All other components within normal limits  CBC WITH DIFFERENTIAL/PLATELET  TROPONIN I    EKG  EKG Interpretation  Date/Time:  Tuesday May 23 2017 13:38:39 EST Ventricular Rate:  56 PR Interval:  138 QRS Duration: 88 QT Interval:  396 QTC Calculation: 382 R Axis:   -12 Text Interpretation:  Sinus bradycardia Otherwise normal ECG Confirmed by Rolland PorterJames, Wess ([id number]) on 05/23/2017 3:09:03 PM       Radiology No results found.  Procedures Procedures (including critical care time)  Medications Ordered in ED Medications  sodium chloride 0.9 % bolus 500 mL (500 mLs Intravenous New Bag/Given 05/23/17 1538)     Initial Impression / Assessment and Plan / ED Course  I have reviewed the triage vital signs and the nursing notes.  Pertinent labs & imaging results that were available during my care of the patient were reviewed by me and considered in my medical decision making (see chart for details).     Unchanged.  No ischemic changes.  No ectopy.  Hemoglobin normal.  Normal renal function.  Troponin normal.  Think is appropriate for discharge home.  We will discontinue his Proscar.  Increase salt temporarily.  Primary care follow-up.  Final Clinical Impressions(s) / ED Diagnoses   Final diagnoses:  Orthostasis    ED Discharge Orders    None       Rolland Porter, MD 05/23/17 1642

## 2017-06-08 ENCOUNTER — Encounter: Payer: Self-pay | Admitting: Physician Assistant

## 2017-06-08 ENCOUNTER — Ambulatory Visit: Payer: BLUE CROSS/BLUE SHIELD | Admitting: Physician Assistant

## 2017-06-08 VITALS — Ht 68.0 in | Wt 179.8 lb

## 2017-06-08 DIAGNOSIS — E785 Hyperlipidemia, unspecified: Secondary | ICD-10-CM

## 2017-06-08 DIAGNOSIS — R42 Dizziness and giddiness: Secondary | ICD-10-CM

## 2017-06-08 DIAGNOSIS — I2510 Atherosclerotic heart disease of native coronary artery without angina pectoris: Secondary | ICD-10-CM

## 2017-06-08 DIAGNOSIS — A084 Viral intestinal infection, unspecified: Secondary | ICD-10-CM | POA: Diagnosis not present

## 2017-06-08 NOTE — Patient Instructions (Addendum)
Medication Instructions:  Your physician recommends that you continue on your current medications as directed. Please refer to the Current Medication list given to you today.   Labwork: None ordered  Testing/Procedures: None ordered  Follow-Up: Your physician recommends that you schedule a follow-up appointment in: SEE DR. Mayford KnifeURNER AS PLANNED   Any Other Special Instructions Will Be Listed Below (If Applicable).     If you need a refill on your cardiac medications before your next appointment, please call your pharmacy.

## 2017-06-08 NOTE — Progress Notes (Addendum)
Cardiology Office Note    Date:  06/08/2017  ID:  Benjamin Hoffman, DOB [birth year], MRN [id number] PCP:  Maylon Peppers, MD  Cardiologist:  Dr. Radford Pax   Chief Complaint: f/u dizziness  History of Present Illness:  Benjamin Hoffman is a 62 y.o. male with history of CAD (NSTEMI 2016 s/p DES to RCA), EF 55-60% at time of cath and hyperlipidemia who presents for follow-up of orthostasis.   His last cath and echo were at time of PCI as above.Cath otherwise showed 100% RPDA lesion - distal embolization wtih faint flow to the apical segment with competitive flow from the LAD collaterals; normal LVEDP. 2D echo was essentially normal with EF 19-41%, normal diastolic function. He was on DAPT with ASA and Brilinta but, after 12 months of therapy, Brilinta was stopped and Plavix added. Last LDL in 12/2016 was 45.  He presents for f/u of intermittent lightheadness. He recently had a GI bug and had poor oral intake. About a week later he began noticing sometimes when he stood up he would feel a second or two of lightheadedness. He did not want to waste time waiting for it to get better if something was wrong so he went to [place] just to be sure.  He had also recently begun Proscar for hair growth. Labs showed normal CMET except glucose 101, normal CBC with Hgb 15, negative troponin. Although not explicitly outlined in the note, his discharge diagnosis was orthostasis and Proscar was discontinued. HR was 56 and BP was 142/90. Orthostatic vital signs were not logged. He reports he received some IV fluids and felt much better. Since that visit he's felt well. He has since restarted the Proscar and hasn't had any further issues with dizziness. Orthostatic VS were done today in clinic - BP went from 122/79 to 108/78 with standing and HR 68 to 86 but he had no symptoms associated with this. 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   Past Medical History:  Diagnosis Date    . CAD (coronary artery disease), native coronary artery 03/2015   a. NSTEMI 2016 s/p DES to RCA.  Marland Kitchen Hyperlipidemia LDL goal <70   . NSTEMI (non-ST elevated myocardial infarction) ([place]) 03/2015  . S/P angioplasty with stent 03/2015   to RCA, with DES    Past Surgical History:  Procedure Laterality Date  . APPENDECTOMY    . CARDIAC CATHETERIZATION N/A 03/06/2015   Procedure: Left Heart Cath and Coronary Angiography;  Surgeon: Leonie Man, MD;  Location: [facility] CV LAB;  Service: Cardiovascular;  Laterality: N/A;  . CARDIAC CATHETERIZATION N/A 03/06/2015   Procedure: Coronary Stent Intervention;  Surgeon: Leonie Man, MD;  Location: [place] CV LAB;  Service: Cardiovascular;  Laterality: N/A;    Current Medications: Current Meds  Medication Sig  . aspirin EC 81 MG tablet Take 1 tablet (81 mg total) by mouth daily.  Marland Kitchen atorvastatin (LIPITOR) 80 MG tablet Take 1 tablet (80 mg total) by mouth daily at 6 PM.  . clopidogrel (PLAVIX) 75 MG tablet Take 1 tablet (75 mg total) by mouth daily.  . finasteride (PROSCAR) 5 MG tablet Take 2.5 mg by mouth daily.  . metoprolol tartrate (LOPRESSOR) 25 MG tablet Take 1 tablet (25 mg total) by mouth 2 (two) times daily. Please keep 01/05/17 appt for future refills  . nitroGLYCERIN (NITROSTAT) 0.4 MG SL tablet Place 1 tablet (0.4 mg total) under the tongue  every 5 (five) minutes x 3 doses as needed for chest pain.    Allergies:   Penicillins   Social History   Socioeconomic History  . Marital status: Married    Spouse name: None  . Number of children: None  . Years of education: None  . Highest education level: None  Social Needs  . Financial resource strain: None  . Food insecurity - worry: None  . Food insecurity - inability: None  . Transportation needs - medical: None  . Transportation needs - non-medical: None  Occupational History  . None  Tobacco Use  . Smoking status: Former Smoker    Packs/day: 1.00    Years: 30.00     Pack years: 30.00    Last attempt to quit: 03/04/2009    Years since quitting: 8.2  . Smokeless tobacco: Never Used  Substance and Sexual Activity  . Alcohol use: No  . Drug use: No  . Sexual activity: None  Other Topics Concern  . None  Social History Narrative  . None     Family History:  Family History  Problem Relation Age of Onset  . CVA Father   . Hypertension Father   . Diverticulitis Brother   . Heart attack Maternal Grandfather 43       died of MI  . Heart disease Maternal Grandfather      ROS:   Please see the history of present illness.  All other systems are reviewed and otherwise negative.    PHYSICAL EXAM:   VS:  Ht '5\' 8"'$  (1.727 m)   Wt 179 lb 12.8 oz (81.6 kg)   SpO2 97%   BMI 27.34 kg/m   BMI: Body mass index is 27.34 kg/m. GEN: Well nourished, well developed WM, in no acute distress  HEENT: normocephalic, atraumatic Neck: no JVD, carotid bruits, or masses Cardiac: RRR; no murmurs, rubs, or gallops, no edema  Respiratory:  clear to auscultation bilaterally, normal work of breathing GI: soft, nontender, nondistended, + BS MS: no deformity or atrophy  Skin: warm and dry, no rash Neuro:  Alert and Oriented x 3, Strength and sensation are intact, follows commands Psych: euthymic mood, full affect  Wt Readings from Last 3 Encounters:  06/08/17 179 lb 12.8 oz (81.6 kg)  05/23/17 179 lb (81.2 kg)  01/05/17 178 lb 12.8 oz (81.1 kg)      Studies/Labs Reviewed:   EKG:  EKG was ordered today and personally reviewed by me and demonstrates NSR 71bpm icomplete RBBB no acute change from prior.  Recent Labs: 05/23/2017: ALT 30; BUN 10; Creatinine, Ser 0.90; Hemoglobin 15.1; Platelets 166; Potassium 3.8; Sodium 138   Lipid Panel    Component Value Date/Time   CHOL 97 (L) 01/26/2017 0753   TRIG 74 01/26/2017 0753   HDL 37 (L) 01/26/2017 0753   CHOLHDL 2.6 01/26/2017 0753   CHOLHDL 2.4 12/18/2015 1204   VLDL 14 12/18/2015 1204   LDLCALC 45 01/26/2017  0753    Additional studies/ records that were reviewed today include: Summarized above    ASSESSMENT & PLAN:   1. Lightheadedness - he reports this was extremely mild, lasting a second or two, every so often when standing. This occurred in the week following a GI illness and he was not yet back to normal oral intake. He has since gotten back to normal intake and episodes have not recurred. Discussed options of either empirically reducing metoprolol in half versus continuing current regimen and observing for recurrence. He elected  the latter. EKG unchanged from prior. Exam benign; normotensive; does not formally meet orthostatic parameters. No carotid bruit on exam. Warning symptoms reviewed. Also discussed parameters for staying hydrated and limiting caffeine to 1-2 drinks a day. 2. CAD - continue ASA, BB, statin, Plavix. No recent angina. 3. Hyperlipidemia  - continue statin. LDL recently was at goal. 4. Recent viral gastroenteritis - resolved.   Disposition: F/u with Dr. Radford Pax as previously recommended per recall 12/2017; sooner if needed.  Medication Adjustments/Labs and Tests Ordered: Current medicines are reviewed at length with the patient today.  Concerns regarding medicines are outlined above. Medication changes, Labs and Tests ordered today are summarized above and listed in the Patient Instructions accessible in Encounters.   Signed, Charlie Pitter, PA-C  06/08/2017 3:38 PM    [facility] [address], [place], [place]  [address] Phone: [phone]; Fax: [phone]

## 2017-09-28 ENCOUNTER — Telehealth: Payer: Self-pay | Admitting: Cardiology

## 2017-09-28 NOTE — Telephone Encounter (Signed)
Pt instructed to arrive at [facility] on 01/22/18 with Dr. Mayford Knife  fasting for annual FLP and LFT. Pt stated understanding and thankful for the call

## 2017-09-28 NOTE — Telephone Encounter (Signed)
New Message:      Pt is wanting to know if he should have labs done the day of his visit on 01/22/18.

## 2017-12-22 ENCOUNTER — Other Ambulatory Visit: Payer: Self-pay | Admitting: Cardiology

## 2018-01-08 ENCOUNTER — Encounter: Payer: Self-pay | Admitting: Cardiology

## 2018-01-13 ENCOUNTER — Other Ambulatory Visit: Payer: Self-pay | Admitting: Cardiology

## 2018-01-17 ENCOUNTER — Telehealth: Payer: Self-pay | Admitting: Cardiology

## 2018-01-17 NOTE — Telephone Encounter (Signed)
New Message   Pt is calling to see if he needs labs and if so he wants to know if he can have it scheduled the same day as his appt with Dr. Mayford Knifeurner

## 2018-01-17 NOTE — Telephone Encounter (Signed)
Spoke with patient to confirm that he is having fasting labs at his office visit with Dr. Mayford Knifeurner.

## 2018-01-22 ENCOUNTER — Encounter: Payer: Self-pay | Admitting: Cardiology

## 2018-01-22 ENCOUNTER — Ambulatory Visit: Payer: BLUE CROSS/BLUE SHIELD | Admitting: Cardiology

## 2018-01-22 VITALS — BP 120/84 | HR 64 | Ht 68.0 in | Wt 180.8 lb

## 2018-01-22 DIAGNOSIS — E785 Hyperlipidemia, unspecified: Secondary | ICD-10-CM

## 2018-01-22 DIAGNOSIS — I2510 Atherosclerotic heart disease of native coronary artery without angina pectoris: Secondary | ICD-10-CM

## 2018-01-22 LAB — HEPATIC FUNCTION PANEL
ALBUMIN: 4 g/dL (ref 3.6–4.8)
ALT: 22 IU/L (ref 0–44)
AST: 18 IU/L (ref 0–40)
Alkaline Phosphatase: 71 IU/L (ref 39–117)
BILIRUBIN TOTAL: 0.5 mg/dL (ref 0.0–1.2)
BILIRUBIN, DIRECT: 0.17 mg/dL (ref 0.00–0.40)
Total Protein: 6.1 g/dL (ref 6.0–8.5)

## 2018-01-22 LAB — LIPID PANEL
CHOL/HDL RATIO: 2.4 ratio (ref 0.0–5.0)
Cholesterol, Total: 91 mg/dL — ABNORMAL LOW (ref 100–199)
HDL: 38 mg/dL — AB (ref >39)
LDL Calculated: 42 mg/dL (ref 0–99)
Triglycerides: 53 mg/dL (ref 0–149)
VLDL Cholesterol Cal: 11 mg/dL (ref 5–40)

## 2018-01-22 NOTE — Progress Notes (Signed)
Cardiology Office Note:    Date:  01/22/2018   ID:  Benjamin BaileyMark Hoffman, DOB [birth year], MRN [id number]  PCP:  Benjamin BayHawks, [name], MD  Cardiologist:  Armanda Magicraci Benjamin Skalla, MD    Referring MD: Benjamin BayHawks, [name], MD   Chief Complaint  Patient presents with  . Coronary Artery Disease  . Hyperlipidemia    History of Present Illness:    Benjamin Hoffman is a 62 y.o. male with a hx of ASCAD with NSTEMI in 2016 with cath showing 100% RCA stenosis s/p PCI with promus premier stent-DES. EF was preserved at 55-60-% at time of cath. He was on DAPT with ASA and Brilinta but, after 12 months of therapy, Brilinta was stopped and Plavix added. He has a history also of dyslipidemia. He is on statin therapy with Lipitor. Last lipid panel 12/2015 showed LDL to be at goal at 47 mg/dL.   Past Medical History:  Diagnosis Date  . CAD (coronary artery disease), native coronary artery 03/2015   a. NSTEMI 2016 s/p DES to RCA.  Marland Kitchen. Hyperlipidemia LDL goal <70   . NSTEMI (non-ST elevated myocardial infarction) (HCC) 03/2015  . S/P angioplasty with stent 03/2015   to RCA, with DES    Past Surgical History:  Procedure Laterality Date  . APPENDECTOMY    . CARDIAC CATHETERIZATION Hoffman/A 03/06/2015   Procedure: Left Heart Cath and Coronary Angiography;  Surgeon: [name], MD;  [facility];  Service: Cardiovascular;  Laterality: Hoffman/A;  . CARDIAC CATHETERIZATION Hoffman/A 03/06/2015   Procedure: Coronary Stent Intervention;  Surgeon: [name], MD;  [facility];  Service: Cardiovascular;  Laterality: Hoffman/A;    Current Medications: Current Meds  Medication Sig  . aspirin EC 81 MG tablet Take 1 tablet (81 mg total) by mouth daily.  Marland Kitchen. atorvastatin (LIPITOR) 80 MG tablet TAKE 1 TABLET(80 MG) BY MOUTH DAILY AT 6 PM  . clopidogrel (PLAVIX) 75 MG tablet Take 75 mg by mouth daily.  . finasteride (PROSCAR) 5 MG tablet Take 2.5 mg by mouth daily.  . metoprolol tartrate (LOPRESSOR) 25 MG tablet TAKE 1 TABLET BY  MOUTH TWICE DAILY  . nitroGLYCERIN (NITROSTAT) 0.4 MG SL tablet Place 1 tablet (0.4 mg total) under the tongue every 5 (five) minutes x 3 doses as needed for chest pain.     Allergies:   Penicillins   Social History   Socioeconomic History  . Marital status: Married    Spouse name: Not on file  . Number of children: Not on file  . Years of education: Not on file  . Highest education level: Not on file  Occupational History  . Not on file  Social Needs  . Financial resource strain: Not on file  . Food insecurity:    Worry: Not on file    Inability: Not on file  . Transportation needs:    Medical: Not on file    Non-medical: Not on file  Tobacco Use  . Smoking status: Former Smoker    Packs/day: 1.00    Years: 30.00    Pack years: 30.00    Last attempt to quit: 03/04/2009    Years since quitting: 8.8  . Smokeless tobacco: Never Used  Substance and Sexual Activity  . Alcohol use: No  . Drug use: No  . Sexual activity: Not on file  Lifestyle  . Physical activity:    Days per week: Not on file    Minutes per session: Not on file  .  Stress: Not on file  Relationships  . Social connections:    Talks on phone: Not on file    Gets together: Not on file    Attends religious service: Not on file    Active member of club or organization: Not on file    Attends meetings of clubs or organizations: Not on file    Relationship status: Not on file  Other Topics Concern  . Not on file  Social History Narrative  . Not on file     Family History: The patient's family history includes CVA in his father; Diverticulitis in his brother; Heart attack (age of onset: 74) in his maternal grandfather; Heart disease in his maternal grandfather; Hypertension in his father.  ROS:   Please see the history of present illness.    ROS  All other systems reviewed and negative.   EKGs/Labs/Other Studies Reviewed:    The following studies were reviewed today: none  EKG:  EKG is not  ordered today.    Recent Labs: 05/23/2017: ALT 30; BUN 10; Creatinine, Ser 0.90; Hemoglobin 15.1; Platelets 166; Potassium 3.8; Sodium 138   Recent Lipid Panel    Component Value Date/Time   CHOL 97 (L) 01/26/2017 0753   TRIG 74 01/26/2017 0753   HDL 37 (L) 01/26/2017 0753   CHOLHDL 2.6 01/26/2017 0753   CHOLHDL 2.4 12/18/2015 1204   VLDL 14 12/18/2015 1204   LDLCALC 45 01/26/2017 0753    Physical Exam:    VS:  BP 120/84   Pulse 64   Ht 5\' 8"  (1.727 m)   Wt 180 lb 12.8 oz (82 kg)   SpO2 97%   BMI 27.49 kg/m     Wt Readings from Last 3 Encounters:  01/22/18 180 lb 12.8 oz (82 kg)  06/08/17 179 lb 12.8 oz (81.6 kg)  05/23/17 179 lb (81.2 kg)     GEN:  Well nourished, well developed in no acute distress HEENT: Normal NECK: No JVD; No carotid bruits LYMPHATICS: No lymphadenopathy CARDIAC: RRR, no murmurs, rubs, gallops RESPIRATORY:  Clear to auscultation without rales, wheezing or rhonchi  ABDOMEN: Soft, non-tender, non-distended MUSCULOSKELETAL:  No edema; No deformity  SKIN: Warm and dry NEUROLOGIC:  Alert and oriented x 3 PSYCHIATRIC:  Normal affect   ASSESSMENT:    1. Coronary artery disease involving native coronary artery of native heart without angina pectoris   2. Hyperlipidemia with target LDL less than 70    PLAN:    In order of problems listed above:  1.  ASCAD - s/p NSTEMI in 2016 with cath showing 100% RCA stenosis s/p PCI with promus premier stent-DES. He has not had any further anginal symptoms.  He will continue on aspirin 81 mg daily, beta-blocker and statin.  2.  Hyperlipidemia -I will repeat an FLP and ALT today.  Continue atorvastatin 80 mg daily.   Medication Adjustments/Labs and Tests Ordered: Current medicines are reviewed at length with the patient today.  Concerns regarding medicines are outlined above.  No orders of the defined types were placed in this encounter.  No orders of the defined types were placed in this  encounter.   Signed, Armanda Magic, MD  01/22/2018 10:51 AM    [facility]

## 2018-01-22 NOTE — Patient Instructions (Signed)
Medication Instructions:  Your physician recommends that you continue on your current medications as directed. Please refer to the Current Medication list given to you today.  Labwork: Today: Lipid and LIver  Follow-Up: Your physician wants you to follow-up in: 1 year with Dr. Mayford Knifeurner. You will receive a reminder letter in the mail two months in advance. If you don't receive a letter, please call our office to schedule the follow-up appointment.   If you need a refill on your cardiac medications before your next appointment, please call your pharmacy.

## 2018-02-26 ENCOUNTER — Other Ambulatory Visit: Payer: Self-pay | Admitting: Cardiology

## 2018-04-01 DIAGNOSIS — M25461 Effusion, right knee: Secondary | ICD-10-CM | POA: Diagnosis not present

## 2018-04-01 DIAGNOSIS — M79604 Pain in right leg: Secondary | ICD-10-CM | POA: Diagnosis not present

## 2018-06-22 ENCOUNTER — Other Ambulatory Visit: Payer: Self-pay | Admitting: Cardiology

## 2018-07-18 ENCOUNTER — Other Ambulatory Visit: Payer: Self-pay | Admitting: *Deleted

## 2018-07-18 MED ORDER — ATORVASTATIN CALCIUM 80 MG PO TABS: ORAL_TABLET | ORAL | 5 refills | Status: DC

## 2019-01-16 ENCOUNTER — Other Ambulatory Visit: Payer: Self-pay | Admitting: Cardiology

## 2019-01-16 MED ORDER — ATORVASTATIN CALCIUM 80 MG PO TABS
ORAL_TABLET | ORAL | 0 refills | Status: DC
Start: 2019-01-16 — End: 2019-01-30

## 2019-01-29 NOTE — Progress Notes (Signed)
Virtual Visit via Telephone Note   This visit type was conducted due to national recommendations for restrictions regarding the COVID-19 Pandemic (e.g. social distancing) in an effort to limit this patient's exposure and mitigate transmission in our community.  Due to his co-morbid illnesses, this patient is at least at moderate risk for complications without adequate follow up.  This format is felt to be most appropriate for this patient at this time.  All issues noted in this document were discussed and addressed.  A limited physical exam was performed with this format.  Please refer to the patient's chart for his consent to telehealth for [facility].   Evaluation Performed:  Follow-up visit  This visit type was conducted due to national recommendations for restrictions regarding the COVID-19 Pandemic (e.g. social distancing).  This format is felt to be most appropriate for this patient at this time.  All issues noted in this document were discussed and addressed.  No physical exam was performed (except for noted visual exam findings with Video Visits).  Please refer to the patient's chart (MyChart message for video visits and phone note for telephone visits) for the patient's consent to telehealth for [facility].  Date:  01/30/2019   ID:  Benjamin Hoffman, DOB 24-Apr-1956, MRN [id number]  Patient Location:  Home  Provider location:   [place]  PCP:  Cheral Bay, MD  Cardiologist:  Armanda Magic, MD  Electrophysiologist:  None   Chief Complaint:  CAD, HLD  History of Present Illness:    Benjamin Hoffman is a 63 y.o. male who presents via audio/video conferencing for a telehealth visit today.    Benjamin Hoffman is a 63 y.o. male with a hx of ASCAD with NSTEMIin 2016with cath showing 100% RCA stenosis s/p PCI with promus premier stent-DES. EF was preserved at 55-60-% at time of cath. Hewason DAPT with ASA and Brilintabut, after 12 months of therapy, Brilinta was stopped and Plavix  added.He has a history also of dyslipidemia.  He is here today for followup and is doing well.  He denies any chest pain or pressure, PND, orthopnea, LE edema, dizziness, palpitations or syncope. He rarely will have some fleeting DOE that is very brief and very rare.  He is compliant with his meds and is tolerating meds with no SE.    The patient does not have symptoms concerning for COVID-19 infection (fever, chills, cough, or new shortness of breath).   Prior CV studies:   The following studies were reviewed today:  None  Past Medical History:  Diagnosis Date  . CAD (coronary artery disease), native coronary artery 03/2015   a. NSTEMI 2016 s/p DES to RCA.  Marland Kitchen Hyperlipidemia LDL goal <70   . NSTEMI (non-ST elevated myocardial infarction) (HCC) 03/2015  . S/P angioplasty with stent 03/2015   to RCA, with DES   Past Surgical History:  Procedure Laterality Date  . APPENDECTOMY    . CARDIAC CATHETERIZATION N/A 03/06/2015   Procedure: Left Heart Cath and Coronary Angiography;  Surgeon: Marykay Lex, MD;  Location: [facility] INVASIVE CV LAB;  Service: Cardiovascular;  Laterality: N/A;  . CARDIAC CATHETERIZATION N/A 03/06/2015   Procedure: Coronary Stent Intervention;  Surgeon: Marykay Lex, MD;  Location: [facility] INVASIVE CV LAB;  Service: Cardiovascular;  Laterality: N/A;     No outpatient medications have been marked as taking for the 01/30/19 encounter (Telemedicine) with Quintella Reichert, MD.     Allergies:   Penicillins   Social History  Tobacco Use  . Smoking status: Former Smoker    Packs/day: 1.00    Years: 30.00    Pack years: 30.00    Quit date: 03/04/2009    Years since quitting: 9.9  . Smokeless tobacco: Never Used  Substance Use Topics  . Alcohol use: No  . Drug use: No     Family Hx: The patient's family history includes CVA in his father; Diverticulitis in his brother; Heart attack (age of onset: 21) in his maternal grandfather; Heart disease in his maternal  grandfather; Hypertension in his father.  ROS:   Please see the history of present illness.     All other systems reviewed and are negative.   Labs/Other Tests and Data Reviewed:    Recent Labs: No results found for requested labs within last 8760 hours.   Recent Lipid Panel Lab Results  Component Value Date/Time   CHOL 91 (L) 01/22/2018 11:05 AM   TRIG 53 01/22/2018 11:05 AM   HDL 38 (L) 01/22/2018 11:05 AM   CHOLHDL 2.4 01/22/2018 11:05 AM   CHOLHDL 2.4 12/18/2015 12:04 PM   LDLCALC 42 01/22/2018 11:05 AM    Wt Readings from Last 3 Encounters:  01/30/19 174 lb (78.9 kg)  01/22/18 180 lb 12.8 oz (82 kg)  06/08/17 179 lb 12.8 oz (81.6 kg)     Objective:    Vital Signs:  Ht 5\' 8"  (1.727 m)   Wt 174 lb (78.9 kg)   BMI 26.46 kg/m     ASSESSMENT & PLAN:    1.  ASCAD -NSTEMIin [id number] cath showing 100% RCA stenosis s/p PCI with promus premier stent-DES. EF was preserved at 55-60-% at time of cath. -he has not had any anginal sx -continue ASA 81mg  daily, Plavix 75mg  daily, BB, statin.   2.  HLD -LDL goal < 70 -LDL 42 a year ago -check FLP and ALT -continue on Atorvastatin 80mg  daily   COVID-19 Education: The signs and symptoms of COVID-19 were discussed with the patient and how to seek care for testing (follow up with PCP or arrange E-visit).  The importance of social distancing was discussed today.  Patient Risk:   After full review of this patient's clinical status, I feel that they are at least moderate risk at this time.  Time:   Today, I have spent 20 minutes directly with the patient on telemedicine discussing medical problems including CAD and HLD.  We also reviewed the symptoms of COVID 19 and the ways to protect against contracting the virus with telehealth technology.  I spent an additional 5 minutes reviewing patient's chart including labs.  Medication Adjustments/Labs and Tests Ordered: Current medicines are reviewed at length with the patient  today.  Concerns regarding medicines are outlined above.  Tests Ordered: No orders of the defined types were placed in this encounter.  Medication Changes: No orders of the defined types were placed in this encounter.   Disposition:  Follow up 1 year  Signed, Fransico Him, MD  01/30/2019 9:33 AM    [place]

## 2019-01-30 ENCOUNTER — Other Ambulatory Visit: Payer: Self-pay | Admitting: *Deleted

## 2019-01-30 ENCOUNTER — Telehealth (INDEPENDENT_AMBULATORY_CARE_PROVIDER_SITE_OTHER): Payer: BC Managed Care – PPO | Admitting: Cardiology

## 2019-01-30 ENCOUNTER — Other Ambulatory Visit: Payer: Self-pay

## 2019-01-30 VITALS — Ht 68.0 in | Wt 174.0 lb

## 2019-01-30 DIAGNOSIS — I2510 Atherosclerotic heart disease of native coronary artery without angina pectoris: Secondary | ICD-10-CM | POA: Diagnosis not present

## 2019-01-30 DIAGNOSIS — E785 Hyperlipidemia, unspecified: Secondary | ICD-10-CM

## 2019-01-30 MED ORDER — CLOPIDOGREL BISULFATE 75 MG PO TABS: ORAL_TABLET | ORAL | 3 refills | Status: DC

## 2019-01-30 MED ORDER — ATORVASTATIN CALCIUM 80 MG PO TABS: ORAL_TABLET | ORAL | 3 refills | Status: DC

## 2019-01-30 MED ORDER — METOPROLOL TARTRATE 25 MG PO TABS: 25.0000 mg | ORAL_TABLET | Freq: Two times a day (BID) | ORAL | 3 refills | Status: DC

## 2019-01-30 NOTE — Patient Instructions (Signed)
Your physician recommends that you continue on your current medications as directed. Please refer to the Current Medication list given to you today.   Your physician recommends that you return for lab work in:  FASTING LIPID AND ALT  Your physician wants you to follow-up in: YEAR WITH DR TURNER   You will receive a reminder letter in the mail two months in advance. If you don't receive a letter, please call our office to schedule the follow-up appointment.  

## 2019-02-19 DIAGNOSIS — Z23 Encounter for immunization: Secondary | ICD-10-CM | POA: Diagnosis not present

## 2019-02-19 DIAGNOSIS — Z0000 Encounter for general adult medical examination without abnormal findings: Secondary | ICD-10-CM | POA: Diagnosis not present

## 2019-02-19 DIAGNOSIS — Z125 Encounter for screening for malignant neoplasm of prostate: Secondary | ICD-10-CM | POA: Diagnosis not present

## 2019-04-25 ENCOUNTER — Other Ambulatory Visit: Payer: Self-pay | Admitting: Cardiology

## 2019-10-05 ENCOUNTER — Emergency Department (HOSPITAL_BASED_OUTPATIENT_CLINIC_OR_DEPARTMENT_OTHER)
Admission: EM | Admit: 2019-10-05 | Discharge: 2019-10-05 | Disposition: A | Discharge status: Home / Self Care | Payer: BC Managed Care – PPO | Attending: Emergency Medicine | Admitting: Emergency Medicine

## 2019-10-05 ENCOUNTER — Encounter (HOSPITAL_BASED_OUTPATIENT_CLINIC_OR_DEPARTMENT_OTHER): Payer: Self-pay | Admitting: Emergency Medicine

## 2019-10-05 ENCOUNTER — Emergency Department (HOSPITAL_BASED_OUTPATIENT_CLINIC_OR_DEPARTMENT_OTHER): Payer: BC Managed Care – PPO

## 2019-10-05 ENCOUNTER — Other Ambulatory Visit: Payer: Self-pay

## 2019-10-05 DIAGNOSIS — R112 Nausea with vomiting, unspecified: Secondary | ICD-10-CM | POA: Insufficient documentation

## 2019-10-05 DIAGNOSIS — I2510 Atherosclerotic heart disease of native coronary artery without angina pectoris: Secondary | ICD-10-CM | POA: Insufficient documentation

## 2019-10-05 DIAGNOSIS — Z9889 Other specified postprocedural states: Secondary | ICD-10-CM | POA: Diagnosis not present

## 2019-10-05 DIAGNOSIS — Z880 Allergy status to penicillin: Secondary | ICD-10-CM | POA: Diagnosis not present

## 2019-10-05 DIAGNOSIS — Z794 Long term (current) use of insulin: Secondary | ICD-10-CM | POA: Insufficient documentation

## 2019-10-05 DIAGNOSIS — J449 Chronic obstructive pulmonary disease, unspecified: Secondary | ICD-10-CM | POA: Diagnosis not present

## 2019-10-05 DIAGNOSIS — R001 Bradycardia, unspecified: Secondary | ICD-10-CM | POA: Diagnosis not present

## 2019-10-05 DIAGNOSIS — Z87891 Personal history of nicotine dependence: Secondary | ICD-10-CM | POA: Diagnosis not present

## 2019-10-05 DIAGNOSIS — Z20822 Contact with and (suspected) exposure to covid-19: Secondary | ICD-10-CM | POA: Diagnosis not present

## 2019-10-05 DIAGNOSIS — I10 Essential (primary) hypertension: Secondary | ICD-10-CM | POA: Diagnosis not present

## 2019-10-05 DIAGNOSIS — R0789 Other chest pain: Secondary | ICD-10-CM | POA: Diagnosis not present

## 2019-10-05 DIAGNOSIS — R748 Abnormal levels of other serum enzymes: Secondary | ICD-10-CM | POA: Diagnosis not present

## 2019-10-05 DIAGNOSIS — R030 Elevated blood-pressure reading, without diagnosis of hypertension: Secondary | ICD-10-CM | POA: Diagnosis not present

## 2019-10-05 LAB — CBC WITH DIFFERENTIAL/PLATELET
Abs Immature Granulocytes: 0.09 10*3/uL — ABNORMAL HIGH (ref 0.00–0.07)
Basophils Absolute: 0 10*3/uL (ref 0.0–0.1)
Basophils Relative: 0 %
Eosinophils Absolute: 0 10*3/uL (ref 0.0–0.5)
Eosinophils Relative: 0 %
HCT: 47.2 % (ref 39.0–52.0)
Hemoglobin: 16.3 g/dL (ref 13.0–17.0)
Immature Granulocytes: 1 %
Lymphocytes Relative: 7 %
Lymphs Abs: 1.3 10*3/uL (ref 0.7–4.0)
MCH: 30.5 pg (ref 26.0–34.0)
MCHC: 34.5 g/dL (ref 30.0–36.0)
MCV: 88.4 fL (ref 80.0–100.0)
Monocytes Absolute: 1.1 10*3/uL — ABNORMAL HIGH (ref 0.1–1.0)
Monocytes Relative: 7 %
Neutro Abs: 14.7 10*3/uL — ABNORMAL HIGH (ref 1.7–7.7)
Neutrophils Relative %: 85 %
Platelets: 192 10*3/uL (ref 150–400)
RBC: 5.34 MIL/uL (ref 4.22–5.81)
RDW: 12.4 % (ref 11.5–15.5)
WBC: 17.2 10*3/uL — ABNORMAL HIGH (ref 4.0–10.5)
nRBC: 0 % (ref 0.0–0.2)

## 2019-10-05 LAB — COMPREHENSIVE METABOLIC PANEL
ALT: 34 U/L (ref 0–44)
AST: 21 U/L (ref 15–41)
Albumin: 4.4 g/dL (ref 3.5–5.0)
Alkaline Phosphatase: 64 U/L (ref 38–126)
Anion gap: 11 (ref 5–15)
BUN: 24 mg/dL — ABNORMAL HIGH (ref 8–23)
CO2: 22 mmol/L (ref 22–32)
Calcium: 9.4 mg/dL (ref 8.9–10.3)
Chloride: 105 mmol/L (ref 98–111)
Creatinine, Ser: 0.96 mg/dL (ref 0.61–1.24)
GFR calc Af Amer: >60 mL/min (ref >60)
GFR calc non Af Amer: >60 mL/min (ref >60)
Glucose, Bld: 134 mg/dL — ABNORMAL HIGH (ref 70–99)
Potassium: 4 mmol/L (ref 3.5–5.1)
Sodium: 138 mmol/L (ref 135–145)
Total Bilirubin: 0.7 mg/dL (ref 0.3–1.2)
Total Protein: 7.6 g/dL (ref 6.5–8.1)

## 2019-10-05 LAB — TROPONIN I (HIGH SENSITIVITY)
Troponin I (High Sensitivity): 3 ng/L (ref <18)
Troponin I (High Sensitivity): 3 ng/L (ref <18)

## 2019-10-05 LAB — LIPASE, BLOOD: Lipase: 77 U/L — ABNORMAL HIGH (ref 11–51)

## 2019-10-05 LAB — SARS CORONAVIRUS 2 BY RT PCR (HOSPITAL ORDER, PERFORMED IN ~~LOC~~ HOSPITAL LAB): SARS Coronavirus 2: NEGATIVE

## 2019-10-05 MED ORDER — SODIUM CHLORIDE 0.9 % IV BOLUS
1000.0000 mL | Freq: Once | INTRAVENOUS | Status: AC
  Administered 2019-10-05: 1000 mL via INTRAVENOUS

## 2019-10-05 MED ORDER — DROPERIDOL 2.5 MG/ML IJ SOLN
INTRAMUSCULAR | Status: AC
  Filled 2019-10-05: qty 2

## 2019-10-05 MED ORDER — DROPERIDOL 2.5 MG/ML IJ SOLN
1.2500 mg | Freq: Once | INTRAMUSCULAR | Status: AC
Start: 2019-10-05 — End: 2019-10-05
  Administered 2019-10-05: 1.25 mg via INTRAVENOUS

## 2019-10-05 MED ORDER — METOCLOPRAMIDE HCL 5 MG/ML IJ SOLN
10.0000 mg | Freq: Once | INTRAMUSCULAR | Status: AC
  Administered 2019-10-05: 10 mg via INTRAVENOUS
  Filled 2019-10-05: qty 2

## 2019-10-05 MED ORDER — ONDANSETRON 4 MG PO TBDP: 4.0000 mg | ORAL_TABLET | Freq: Three times a day (TID) | ORAL | 0 refills | Status: DC | PRN

## 2019-10-05 MED ORDER — FENTANYL CITRATE (PF) 100 MCG/2ML IJ SOLN
50.0000 ug | Freq: Once | INTRAMUSCULAR | Status: AC
  Administered 2019-10-05: 50 ug via INTRAVENOUS
  Filled 2019-10-05: qty 2

## 2019-10-05 MED ORDER — ASPIRIN 81 MG PO CHEW
324.0000 mg | CHEWABLE_TABLET | Freq: Once | ORAL | Status: AC
  Administered 2019-10-05: 324 mg via ORAL
  Filled 2019-10-05: qty 4

## 2019-10-05 MED ORDER — ONDANSETRON HCL 4 MG/2ML IJ SOLN
4.0000 mg | Freq: Once | INTRAMUSCULAR | Status: AC
  Administered 2019-10-05: 4 mg via INTRAVENOUS
  Filled 2019-10-05: qty 2

## 2019-10-05 MED ORDER — IOHEXOL 350 MG/ML SOLN
100.0000 mL | Freq: Once | INTRAVENOUS | Status: AC | PRN
  Administered 2019-10-05: 100 mL via INTRAVENOUS

## 2019-10-05 NOTE — ED Triage Notes (Signed)
  Patient comes in with centralized chest pain that started around 0400 this morning.  Patient states pain woke him up out of his sleep and he has had a sick feeling ever since.  Had nausea and 3 emesis episodes.  Patient diaphoretic and clammy.  Afebrile.  No sick contacts.  Hx MI in 2016.  On plavix.

## 2019-10-05 NOTE — ED Provider Notes (Signed)
Signout note  64 year old male with history of coronary artery disease presented to ER with abdominal pain nausea and vomiting as well as some nonspecific chest pain.  EKG without ischemic change, initial troponin was within normal limits.  Slight elevation in lipase but otherwise normal labs.  Plan to follow-up repeat troponin and reassessment after antiemetics.  Rechecked patient, still having nausea and vomiting.  Will give dose of droperidol, fentanyl and reassess.  Repeat troponin is negative.  Will check CTA chest abdomen pelvis to rule out dissection, acute abdominal process.  CTA chest abdomen pelvis was negative for acute pathology, no dissection, normal pancreas, normal gallbladder.  Radiologist commented on coronary calcifications.  Patient has known history of CAD.  Given there is no elevation in troponins, patient has no ongoing chest pain, feel ACS is much less likely.  Did discuss this finding with patient and recommended that he discuss this with his primary cardiologist. Noted sinus bradycardia, initial was in 40s, but throughout stay trended more in 50s on monitor. BP stable throughout. Suspect this is medication related or vagal mediated due to original nausea. Discussed case with his primary cardiologist who is on call today, Dr. Mayford Knife. She is willing to follow up closely in office, no other changes recommended today. I recommended hold metoprolol for at least today and discuss ongoing management with cards in the office.   On reassessment, patient's symptoms had resolved, no ongoing nausea, chest or abdominal pain.Given the reassuring work up, improvement in symptoms, above assessment regarding cards concerns, believe patient can be discharged and managed in out pt setting. Strongly rec recheck with PCP and cardiology. Stressed need to return if he has recurrent symptoms. Wife at beside and in agreement and understanding of plan.   Milagros Loll, MD 10/05/19 1623

## 2019-10-05 NOTE — ED Provider Notes (Signed)
[facility] EMERGENCY DEPARTMENT Provider Note   CSN: [id number] Arrival date & time: 10/05/19  0547   History Chief Complaint  Patient presents with  . Chest Pain  . Nausea    Benjamin Hoffman is a 64 y.o. male.  The history is provided by the patient.  Chest Pain He has history of hyperlipidemia, coronary artery disease, ITP and comes in with vague chest discomfort which started about 2 AM.  He describes it as a full feeling in his chest which he rates at 6/10.  There is associated nausea and vomiting.  He has also had significant diaphoresis.  He denies dyspnea.  He denies fever or chills.  There has been no cough.  There has been no treatment at home.  Nothing makes his symptoms better, nothing makes them worse.  Past Medical History:  Diagnosis Date  . CAD (coronary artery disease), native coronary artery 03/2015   a. NSTEMI 2016 s/p DES to RCA.  Marland Kitchen Hyperlipidemia LDL goal <70   . NSTEMI (non-ST elevated myocardial infarction) ([place]) 03/2015  . S/P angioplasty with stent 03/2015   to RCA, with DES    Patient Active Problem List   Diagnosis Date Noted  . CAD (coronary artery disease), native coronary artery 05/14/2015  . Hyperlipidemia with target LDL less than 70 05/14/2015  . NSTEMI (non-ST elevated myocardial infarction) ([place]) 03/05/2015  . Chronic obstructive pulmonary disease ([place]) 10/25/2013  . 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 Cannot sleep 10/25/2013  . Excessive urination at night 10/25/2013    Past Surgical History:  Procedure Laterality Date  . APPENDECTOMY    . CARDIAC CATHETERIZATION N/A 03/06/2015   Procedure: Left Heart Cath and Coronary Angiography;  Surgeon: Leonie Man, MD;  Location: [place] CV LAB;  Service: Cardiovascular;  Laterality: N/A;  . CARDIAC CATHETERIZATION N/A 03/06/2015   Procedure: Coronary Stent Intervention;  Surgeon: Leonie Man, MD;  Location: [place] CV LAB;  Service: Cardiovascular;   Laterality: N/A;       Family History  Problem Relation Age of Onset  . CVA Father   . Hypertension Father   . Diverticulitis Brother   . Heart attack Maternal Grandfather 24       died of MI  . Heart disease Maternal Grandfather     Social History   Tobacco Use  . Smoking status: Former Smoker    Packs/day: 1.00    Years: 30.00    Pack years: 30.00    Quit date: 03/04/2009    Years since quitting: 10.5  . Smokeless tobacco: Never Used  Substance Use Topics  . Alcohol use: No  . Drug use: No    Home Medications Prior to Admission medications   Medication Sig Start Date End Date Taking? Authorizing Provider  aspirin EC 81 MG tablet Take 1 tablet (81 mg total) by mouth daily. 06/20/16   Burnell Blanks, MD  atorvastatin (LIPITOR) 80 MG tablet TAKE 1 TABLET(80 MG) BY MOUTH DAILY AT 6 PM. 01/30/19   Turner, Eber Hong, MD  clopidogrel (PLAVIX) 75 MG tablet TAKE 1 TABLET(75 MG) BY MOUTH DAILY 01/30/19   Sueanne Margarita, MD  finasteride (PROSCAR) 5 MG tablet Take 2.5 mg by mouth daily. 03/20/15   [provider]  metoprolol tartrate (LOPRESSOR) 25 MG tablet TAKE 1 TABLET BY MOUTH TWICE DAILY 04/25/19   Lyda Jester M, PA-C  nitroGLYCERIN (NITROSTAT) 0.4 MG SL tablet Place 1 tablet (0.4 mg total) under the tongue every 5 (  five) minutes x 3 doses as needed for chest pain. 09/05/16   Quintella Reichert, MD    Allergies    Penicillins  Review of Systems   Review of Systems  Cardiovascular: Positive for chest pain.  All other systems reviewed and are negative.   Physical Exam Updated Vital Signs BP (!) 171/88 (BP Location: Right Arm)   Pulse (!) 47   Temp 97.7 F (36.5 C) (Oral)   Resp 12   Ht 5\' 8"  (1.727 m)   Wt 81.6 kg   SpO2 100%   BMI 27.37 kg/m   Physical Exam Vitals and nursing note reviewed.   64 year old male, appears uncomfortable, but is in no acute distress. Vital signs are significant for slow heart rate and elevated blood pressure.  Oxygen saturation is 100%, which is normal. Head is normocephalic and atraumatic. PERRLA, EOMI. Oropharynx is clear. Neck is nontender and supple without adenopathy or JVD. Back is nontender and there is no CVA tenderness. Lungs are clear without rales, wheezes, or rhonchi. Chest is nontender. Heart has regular rate and rhythm without murmur. Abdomen is soft, flat, nontender without masses or hepatosplenomegaly and peristalsis is hypoactive. Extremities have no cyanosis or edema, full range of motion is present. Skin mildly diaphoretic without rash. Neurologic: Mental status is normal, cranial nerves are intact, there are no motor or sensory deficits.  ED Results / Procedures / Treatments   Labs (all labs ordered are listed, but only abnormal results are displayed) Labs Reviewed  COMPREHENSIVE METABOLIC PANEL - Abnormal; Notable for the following components:      Result Value   Glucose, Bld 134 (*)    BUN 24 (*)    All other components within normal limits  LIPASE, BLOOD - Abnormal; Notable for the following components:   Lipase 77 (*)    All other components within normal limits  CBC WITH DIFFERENTIAL/PLATELET - Abnormal; Notable for the following components:   WBC 17.2 (*)    Neutro Abs 14.7 (*)    Monocytes Absolute 1.1 (*)    Abs Immature Granulocytes 0.09 (*)    All other components within normal limits  TROPONIN I (HIGH SENSITIVITY)    EKG EKG Interpretation  Date/Time:  Saturday October 05 2019 05:57:03 EDT Ventricular Rate:  47 PR Interval:    QRS Duration: 98 QT Interval:  432 QTC Calculation: 382 R Axis:   17 Text Interpretation: Sinus bradycardia Low voltage, precordial leads Abnormal R-wave progression, early transition Incomplete right bundle branch block When compared with ECG of 05/23/2017, No significant change was found Confirmed by 05/25/2017 (Dione Booze) on 10/05/2019 6:08:17 AM   Radiology No results found.  Procedures Procedures   Medications Ordered in  ED Medications  ondansetron (ZOFRAN) injection 4 mg (has no administration in time range)  sodium chloride 0.9 % bolus 1,000 mL (has no administration in time range)  aspirin chewable tablet 324 mg (has no administration in time range)    ED Course  I have reviewed the triage vital signs and the nursing notes.  Pertinent labs & imaging results that were available during my care of the patient were reviewed by me and considered in my medical decision making (see chart for details).  MDM Rules/Calculators/A&P Chest discomfort with nausea and vomiting and diaphoresis.  Etiology is unclear.  ECG shows no acute changes, but this clearly could be ACS.  Also, consider GERD, viral gastritis.  Bradycardia and diaphoresis are suggesting some vagal component to symptoms.  We will  give IV fluids and ondansetron as well as aspirin.  Screening labs are being obtained including troponin.  Old records are reviewed showing stent placed in right coronary artery in 2016 but no mention made of any lesions and other vessels at that time.  Labs are significant for leukocytosis and mild elevation of lipase.  There was temporary improvement with ondansetron, but vomiting recurred so he was given a dose of metoclopramide.  Initial troponin is normal.  At this point, I am questioning possible biliary tract origin of discomfort.  Case is signed out to Dr. Stevie Kern.  Final Clinical Impression(s) / ED Diagnoses Final diagnoses:  Chest discomfort  Non-intractable vomiting with nausea, unspecified vomiting type  Bradycardia  Elevated blood pressure reading with diagnosis of hypertension  Elevated lipase    Rx / DC Orders ED Discharge Orders    None       Dione Booze, MD 10/05/19 [phone]

## 2019-10-05 NOTE — Discharge Instructions (Addendum)
Recommend schedule follow-up appointment both with your cardiologist as well as your primary care doctor.  Take Zofran as needed for further nausea control.  If you develop worsening vomiting, any recurrent chest pain or other new concerning symptom, please return to ER for reassessment.

## 2019-10-09 ENCOUNTER — Telehealth: Payer: Self-pay

## 2019-10-09 ENCOUNTER — Telehealth: Payer: Self-pay | Admitting: Cardiology

## 2019-10-09 MED ORDER — NITROGLYCERIN 0.4 MG SL SUBL: 0.4000 mg | SUBLINGUAL_TABLET | SUBLINGUAL | 0 refills | Status: DC | PRN

## 2019-10-09 NOTE — Telephone Encounter (Signed)
Patient called back and I spoke with him regarding symptoms reported by his wife. See additional phone note from today.

## 2019-10-09 NOTE — Telephone Encounter (Signed)
*  STAT* If patient is at the pharmacy, call can be transferred to refill team.   1. Which medications need to be refilled? (please list name of each medication and dose if known)  nitroGLYCERIN (NITROSTAT) 0.4 MG SL tablet  2. Which pharmacy/location (including street and city if local pharmacy) is medication to be sent to? [id number] - [place], [place] - [address]  3. Do they need a 30 day or 90 day supply? 90 day supply

## 2019-10-09 NOTE — Telephone Encounter (Signed)
Pt's medication was sent to pt's pharmacy as requested. Confirmation received.  °

## 2019-10-09 NOTE — Telephone Encounter (Signed)
  Patient Consent for Virtual Visit         Benjamin Hoffman has provided verbal consent on 10/09/2019 for a virtual visit (video or telephone).   CONSENT FOR VIRTUAL VISIT FOR:  Benjamin Hoffman  By participating in this virtual visit I agree to the following:  I hereby voluntarily request, consent and authorize [facility] and its employed or contracted physicians, physician assistants, nurse practitioners or other licensed health care professionals (the Practitioner), to provide me with telemedicine health care services (the "Services") as deemed necessary by the treating Practitioner. I acknowledge and consent to receive the Services by the Practitioner via telemedicine. I understand that the telemedicine visit will involve communicating with the Practitioner through live audiovisual communication technology and the disclosure of certain medical information by electronic transmission. I acknowledge that I have been given the opportunity to request an in-person assessment or other available alternative prior to the telemedicine visit and am voluntarily participating in the telemedicine visit.  I understand that I have the right to withhold or withdraw my consent to the use of telemedicine in the course of my care at any time, without affecting my right to future care or treatment, and that the Practitioner or I may terminate the telemedicine visit at any time. I understand that I have the right to inspect all information obtained and/or recorded in the course of the telemedicine visit and may receive copies of available information for a reasonable fee.  I understand that some of the potential risks of receiving the Services via telemedicine include:  Marland Kitchen Delay or interruption in medical evaluation due to technological equipment failure or disruption; . Information transmitted may not be sufficient (e.g. poor resolution of images) to allow for appropriate medical decision making by the Practitioner; and/or   . In rare instances, security protocols could fail, causing a breach of personal health information.  Furthermore, I acknowledge that it is my responsibility to provide information about my medical history, conditions and care that is complete and accurate to the best of my ability. I acknowledge that Practitioner's advice, recommendations, and/or decision may be based on factors not within their control, such as incomplete or inaccurate data provided by me or distortions of diagnostic images or specimens that may result from electronic transmissions. I understand that the practice of medicine is not an exact science and that Practitioner makes no warranties or guarantees regarding treatment outcomes. I acknowledge that a copy of this consent can be made available to me via my patient portal [facility] MyChart), or I can request a printed copy by calling the office of [facility].    I understand that my insurance will be billed for this visit.   I have read or had this consent read to me. . I understand the contents of this consent, which adequately explains the benefits and risks of the Services being provided via telemedicine.  . I have been provided ample opportunity to ask questions regarding this consent and the Services and have had my questions answered to my satisfaction. . I give my informed consent for the services to be provided through the use of telemedicine in my medical care

## 2019-10-09 NOTE — Telephone Encounter (Signed)
Tammy is calling stating Benjamin Hoffman is currently sick and has been since Saturday morning 10/05/19. She states he saw Dr. Marianna Fuss and they ran many test and determined it may be Gastroenteritis. She states the results also reported hypertension and bradycardia and the Doctor advised he follow up with Dr. Mayford Knife asap, but Orion is currently not well enough to come in. She also states she doesn't wasn't to schedule a virtual due to him getting random episodes when it hits him so she wouldn't know when he would be available to talk. She is requesting Dr. Norris Cross nurse give her a call to see what she advises in regards to it. Please advise.

## 2019-10-09 NOTE — Telephone Encounter (Signed)
Spoke with the patient who states that he went to the ER 06/05 with abdominal pain, N/V, and some chest pain. Cardiac workup was negative however he did have high BP and low HR. He states that he is not currently having any chest pain. His HR and BP have been normal since being seen in ER. He states that he still nauseous and is having trouble eating. I advised the patient to follow up with his PCP in regards to abdominal symptoms. He has been scheduled for a virtual visit with Dr. Mayford Knife on 06/24. Advised to call back if chest pain returns and to continue monitoring HR an BP.

## 2019-10-09 NOTE — Telephone Encounter (Signed)
Agree with plan 

## 2019-10-09 NOTE — Telephone Encounter (Signed)
New Message  Pt c/o of Chest Pain: STAT if CP now or developed within 24 hours  1. Are you having CP right now? No  2. Are you experiencing any other symptoms (ex. SOB, nausea, vomiting, sweating)? Nausea, vomiting, sweating  3. How long have you been experiencing CP? A week  4. Is your CP continuous or coming and going? Coming and going  5. Have you taken Nitroglycerin? No ?

## 2019-10-09 NOTE — Telephone Encounter (Signed)
Left message for Tammy to call back

## 2019-10-24 ENCOUNTER — Telehealth (INDEPENDENT_AMBULATORY_CARE_PROVIDER_SITE_OTHER): Payer: BC Managed Care – PPO | Admitting: Cardiology

## 2019-10-24 ENCOUNTER — Other Ambulatory Visit: Payer: Self-pay

## 2019-10-24 ENCOUNTER — Encounter: Payer: Self-pay | Admitting: Cardiology

## 2019-10-24 VITALS — BP 126/92 | Ht 68.0 in | Wt 171.0 lb

## 2019-10-24 DIAGNOSIS — E785 Hyperlipidemia, unspecified: Secondary | ICD-10-CM | POA: Diagnosis not present

## 2019-10-24 DIAGNOSIS — R001 Bradycardia, unspecified: Secondary | ICD-10-CM

## 2019-10-24 DIAGNOSIS — I2510 Atherosclerotic heart disease of native coronary artery without angina pectoris: Secondary | ICD-10-CM

## 2019-10-24 NOTE — Patient Instructions (Signed)
Medication Instructions:  °Your physician recommends that you continue on your current medications as directed. Please refer to the Current Medication list given to you today. ° °*If you need a refill on your cardiac medications before your next appointment, please call your pharmacy* ° °Follow-Up: °At [facility], you and your health needs are our priority.  As part of our continuing mission to provide you with exceptional heart care, we have created designated Provider Care Teams.  These Care Teams include your primary Cardiologist (physician) and Advanced Practice Providers (APPs -  Physician Assistants and Nurse Practitioners) who all work together to provide you with the care you need, when you need it. ° ° °Your next appointment:   °1 year(s) ° °The format for your next appointment:   °Either In Person or Virtual ° °Provider:   °You may see Traci Turner, MD or one of the following Advanced Practice Providers on your designated Care Team:   °· Dayna Dunn, PA-C °· Michele Lenze, PA-C ° ° °

## 2019-10-24 NOTE — Progress Notes (Signed)
Virtual Visit via Telephone Note   This visit type was conducted due to national recommendations for restrictions regarding the COVID-19 Pandemic (e.g. social distancing) in an effort to limit this patient's exposure and mitigate transmission in our community.  Due to his co-morbid illnesses, this patient is at least at moderate risk for complications without adequate follow up.  This format is felt to be most appropriate for this patient at this time.  The patient did not have access to video technology/had technical difficulties with video requiring transitioning to audio format only (telephone).  All issues noted in this document were discussed and addressed.  No physical exam could be performed with this format.  Please refer to the patient's chart for his  consent to telehealth for [facility].  Evaluation Performed:  Follow-up visit  This visit type was conducted due to national recommendations for restrictions regarding the COVID-19 Pandemic (e.g. social distancing).  This format is felt to be most appropriate for this patient at this time.  All issues noted in this document were discussed and addressed.  No physical exam was performed (except for noted visual exam findings with Video Visits).  Please refer to the patient's chart (MyChart message for video visits and phone note for telephone visits) for the patient's consent to telehealth for [facility], [place].  Date:  10/27/2019   ID:  Benjamin Hoffman, DOB 26-Jun-1955, MRN [id number]  Patient Location:  Home  Provider location:   [place]  PCP:  Cheral Bay, MD  Cardiologist:  Armanda Magic, MD  Electrophysiologist:  None   Chief Complaint:  CAD, HTN, HLD  History of Present Illness:    Benjamin Hoffman is a 64 y.o. male who presents via audio/video conferencing for a telehealth visit today.    Benjamin Hoffman is a 64 y.o. male with a hx of ASCAD with NSTEMI in 2016 with cath showing 100% RCA stenosis s/p PCI with promus premier stent-DES.  EF was preserved at 55-60-% at time of cath. He was on DAPT with ASA and Brilinta but, after 12 months of therapy, Brilinta was stopped and Plavix added. He has a history also of dyslipidemia. He is on statin therapy with Lipitor.   He was recently seen in the ER 10/05/2019 with abdominal pain and N/V and nonspecific epigastric and left upper abdominal pain.  He had normal hsTrop and EKG.  Abd CT was unremarkable.  He was noted to have some bradycardia with HR initially in the 40's on arrival but mainly in the 50's thereafter and was felt to be vagally mediated from nausea and vomiting he presented with.  It was recommended that he followup with Cardiology outpt. He was seen by [facility] and they felt that his abdominal and epigastric pain was related to colitis that was visible on CT. He was treated with antibx and underwent upper endoscopy and colonoscopy showing severalsmall duodenal bulb ulcers and antral erosions and colonoscopy showed mild ischemic colitis and diverticulosis.  He was treated with pepcid, Cirpro and Flagyl.  He is here today for followup and is doing well.  He has not had any further epigastric discomfort.  He denies any chest pain or pressure, SOB, DOE, PND, orthopnea, LE edema, dizziness, palpitations or syncope. He is compliant with his meds and is tolerating meds with no SE.    The patient does not have symptoms concerning for COVID-19 infection (fever, chills, cough, or new shortness of breath).    Prior CV studies:   The following  studies were reviewed today:  none  Past Medical History:  Diagnosis Date  . CAD (coronary artery disease), native coronary artery 03/2015   a. NSTEMI 2016 s/p DES to RCA.  Marland Kitchen Hyperlipidemia LDL goal <70   . NSTEMI (non-ST elevated myocardial infarction) ([facility]) 03/2015  . S/P angioplasty with stent 03/2015   to RCA, with DES   Past Surgical History:  Procedure Laterality Date  . APPENDECTOMY    . CARDIAC CATHETERIZATION N/A 03/06/2015     Procedure: Left Heart Cath and Coronary Angiography;  Surgeon: Leonie Man, MD;  Location: [place] CV LAB;  Service: Cardiovascular;  Laterality: N/A;  . CARDIAC CATHETERIZATION N/A 03/06/2015   Procedure: Coronary Stent Intervention;  Surgeon: Leonie Man, MD;  Location: [place] CV LAB;  Service: Cardiovascular;  Laterality: N/A;     Current Meds  Medication Sig  . aspirin EC 81 MG tablet Take 1 tablet (81 mg total) by mouth daily.  Marland Kitchen atorvastatin (LIPITOR) 80 MG tablet TAKE 1 TABLET(80 MG) BY MOUTH DAILY AT 6 PM.  . clopidogrel (PLAVIX) 75 MG tablet TAKE 1 TABLET(75 MG) BY MOUTH DAILY  . finasteride (PROSCAR) 5 MG tablet Take 2.5 mg by mouth daily.  . metoprolol tartrate (LOPRESSOR) 25 MG tablet TAKE 1 TABLET BY MOUTH TWICE DAILY  . nitroGLYCERIN (NITROSTAT) 0.4 MG SL tablet Place 1 tablet (0.4 mg total) under the tongue every 5 (five) minutes x 3 doses as needed for chest pain.  Marland Kitchen ondansetron (ZOFRAN ODT) 4 MG disintegrating tablet Take 1 tablet (4 mg total) by mouth every 8 (eight) hours as needed for nausea or vomiting.     Allergies:   Penicillins   Social History   Tobacco Use  . Smoking status: Former Smoker    Packs/day: 1.00    Years: 30.00    Pack years: 30.00    Quit date: 03/04/2009    Years since quitting: 10.6  . Smokeless tobacco: Never Used  Vaping Use  . Vaping Use: Never used  Substance Use Topics  . Alcohol use: No  . Drug use: No     Family Hx: The patient's family history includes CVA in his father; Diverticulitis in his brother; Heart attack (age of onset: 96) in his maternal grandfather; Heart disease in his maternal grandfather; Hypertension in his father.  ROS:   Please see the history of present illness.     All other systems reviewed and are negative.   Labs/Other Tests and Data Reviewed:    Recent Labs: 10/05/2019: ALT 34; BUN 24; Creatinine, Ser 0.96; Hemoglobin 16.3; Platelets 192; Potassium 4.0; Sodium 138   Recent Lipid  Panel Lab Results  Component Value Date/Time   CHOL 91 (L) 01/22/2018 11:05 AM   TRIG 53 01/22/2018 11:05 AM   HDL 38 (L) 01/22/2018 11:05 AM   CHOLHDL 2.4 01/22/2018 11:05 AM   CHOLHDL 2.4 12/18/2015 12:04 PM   LDLCALC 42 01/22/2018 11:05 AM    Wt Readings from Last 3 Encounters:  10/24/19 171 lb (77.6 kg)  10/05/19 180 lb (81.6 kg)  01/30/19 174 lb (78.9 kg)     Objective:    Vital Signs:  BP (!) 126/92   Ht 5\' 8"  (1.727 m)   Wt 171 lb (77.6 kg)   BMI 26.00 kg/m     ASSESSMENT & PLAN:    1.  ASCAD -NSTEMIin [id number] cath showing 100% RCA stenosis s/p PCI with promus premier stent-DES. EF was preserved at 55-60-% at time of cath. -  he has not had any anginal sx -continue ASA 81mg  daily, Plavix 75mg  daily, BB, statin.   2.  HLD -LDL goal < 70 -LDL 56 in Oct 2020 -continue on Atorvastatin 80mg  daily  3.  Bradycardia -noted in ER in setting of N/V -his BP is running a little high today so I have asked him to check his BP and HR daily for a week and call with the results  COVID-19 Education: The signs and symptoms of COVID-19 were discussed with the patient and how to seek care for testing (follow up with PCP or arrange E-visit).  The importance of social distancing was discussed today.  Patient Risk:   After full review of this patient's clinical status, I feel that they are at least moderate risk at this time.  Time:   Today, I have spent 20 minutes on telemedicine discussing medical problems including CAD, HLD, bradycardia and reviewing patient's chart including cardiac cath, outside labs from PCP.  Medication Adjustments/Labs and Tests Ordered: Current medicines are reviewed at length with the patient today.  Concerns regarding medicines are outlined above.  Tests Ordered: No orders of the defined types were placed in this encounter.  Medication Changes: No orders of the defined types were placed in this encounter.   Disposition:  Follow up in 1  year(s)  Signed, , MD  10/27/2019 9:33 PM    [facility]

## 2019-11-14 ENCOUNTER — Other Ambulatory Visit: Payer: Self-pay | Admitting: Cardiology

## 2020-01-26 ENCOUNTER — Other Ambulatory Visit: Payer: Self-pay | Admitting: Cardiology

## 2020-04-25 ENCOUNTER — Other Ambulatory Visit: Payer: Self-pay | Admitting: Cardiology

## 2020-06-22 MED ORDER — METOPROLOL TARTRATE 25 MG PO TABS: 25.0000 mg | ORAL_TABLET | Freq: Two times a day (BID) | ORAL | 1 refills | Status: DC

## 2020-08-21 ENCOUNTER — Telehealth: Payer: Self-pay | Admitting: Cardiology

## 2020-08-21 NOTE — Telephone Encounter (Signed)
Spoke with the patient and advised him that he does not need an appointment for lab work. If Dr. Mayford Knife determines that he needs lab work done after she sees him then we will send him to the lab right after. Patient verbalized understanding.

## 2020-08-21 NOTE — Telephone Encounter (Signed)
   Pt said he was advise by Dr Mayford Knife he can get his blood work same day of his appt. No order on file

## 2020-11-02 NOTE — Progress Notes (Signed)
Date:  11/03/2020   ID:  Benjamin Hoffman, DOB 1955/10/14, MRN [id number]  PCP:  Cheral Bay, MD  Cardiologist:  Armanda Magic, MD  Electrophysiologist:  None   Chief Complaint:  CAD, HTN, HLD  History of Present Illness:    Benjamin Hoffman is a 65 y.o. male with a hx of ASCAD with NSTEMI in 2016 with cath showing 100% RCA stenosis s/p PCI with promus premier stent-DES.  EF was preserved at 55-60-% at time of cath. He was on DAPT with ASA and Brilinta but, after 12 months of therapy, Brilinta was stopped and Plavix added. He has a history also of dyslipidemia. He is on statin therapy with Lipitor.   He is here today for followup and is doing well.  He denies any chest pain or pressure, PND, orthopnea, LE edema, dizziness, palpitations or syncope. Rarely he will have some mild DOE with extreme exertion.  He is compliant with his meds and is tolerating meds with no SE.     Prior CV studies:   The following studies were reviewed today: EKG  Past Medical History:  Diagnosis Date   CAD (coronary artery disease), native coronary artery 03/2015   a. NSTEMI 2016 s/p DES to RCA.   Hyperlipidemia LDL goal <70    NSTEMI (non-ST elevated myocardial infarction) ([facility]) 03/2015   S/P angioplasty with stent 03/2015   to RCA, with DES   Past Surgical History:  Procedure Laterality Date   APPENDECTOMY     CARDIAC CATHETERIZATION N/A 03/06/2015   Procedure: Left Heart Cath and Coronary Angiography;  Surgeon: Marykay Lex, MD;  Location: [facility] INVASIVE CV LAB;  Service: Cardiovascular;  Laterality: N/A;   CARDIAC CATHETERIZATION N/A 03/06/2015   Procedure: Coronary Stent Intervention;  Surgeon: Marykay Lex, MD;  Location: [facility] INVASIVE CV LAB;  Service: Cardiovascular;  Laterality: N/A;     Current Meds  Medication Sig   aspirin EC 81 MG tablet Take 1 tablet (81 mg total) by mouth daily.   atorvastatin (LIPITOR) 80 MG tablet TAKE 1 TABLET(80 MG) BY MOUTH DAILY   clopidogrel (PLAVIX) 75 MG tablet TAKE 1  TABLET BY MOUTH DAILY   finasteride (PROSCAR) 5 MG tablet Take 2.5 mg by mouth daily.   metoprolol tartrate (LOPRESSOR) 25 MG tablet Take 1 tablet (25 mg total) by mouth 2 (two) times daily.   nitroGLYCERIN (NITROSTAT) 0.4 MG SL tablet DISSOLVE 1 TABLET UNDER THE TONGUE EVERY 5 MINUTES FOR A TOTAL OF 3 DOSES AS NEEDED FOR CHEST PAIN   ondansetron (ZOFRAN ODT) 4 MG disintegrating tablet Take 1 tablet (4 mg total) by mouth every 8 (eight) hours as needed for nausea or vomiting.     Allergies:   Penicillins   Social History   Tobacco Use   Smoking status: Former    Packs/day: 1.00    Years: 30.00    Pack years: 30.00    Types: Cigarettes    Quit date: 03/04/2009    Years since quitting: 11.6   Smokeless tobacco: Never  Vaping Use   Vaping Use: Never used  Substance Use Topics   Alcohol use: No   Drug use: No     Family Hx: The patient's family history includes CVA in his father; Diverticulitis in his brother; Heart attack (age of onset: 20) in his maternal grandfather; Heart disease in his maternal grandfather; Hypertension in his father.  ROS:   Please see the history of present illness.     All other systems  reviewed and are negative.   Labs/Other Tests and Data Reviewed:    Recent Labs: No results found for requested labs within last 8760 hours.   Recent Lipid Panel Lab Results  Component Value Date/Time   CHOL 91 (L) 01/22/2018 11:05 AM   TRIG 53 01/22/2018 11:05 AM   HDL 38 (L) 01/22/2018 11:05 AM   CHOLHDL 2.4 01/22/2018 11:05 AM   CHOLHDL 2.4 12/18/2015 12:04 PM   LDLCALC 42 01/22/2018 11:05 AM    Wt Readings from Last 3 Encounters:  11/03/20 188 lb 9.6 oz (85.5 kg)  10/24/19 171 lb (77.6 kg)  10/05/19 180 lb (81.6 kg)     Objective:    Vital Signs:  BP 126/86   Pulse 60   Ht 5\' 8"  (1.727 m)   Wt 188 lb 9.6 oz (85.5 kg)   SpO2 99%   BMI 28.68 kg/m   GEN: Well nourished, well developed in no acute distress HEENT: Normal NECK: No JVD; No carotid  bruits LYMPHATICS: No lymphadenopathy CARDIAC:RRR, no murmurs, rubs, gallops RESPIRATORY:  Clear to auscultation without rales, wheezing or rhonchi  ABDOMEN: Soft, non-tender, non-distended MUSCULOSKELETAL:  No edema; No deformity  SKIN: Warm and dry NEUROLOGIC:  Alert and oriented x 3 PSYCHIATRIC:  Normal affect    EKG was performed in the office today and demonstrated NSR with rSR'  ASSESSMENT & PLAN:    1.  ASCAD -NSTEMI in 2016 with cath showing 100% RCA stenosis s/p PCI with promus premier stent-DES.  EF was preserved at 55-60-% at time of cath. -He denies any anginal sx in the past year -Continue prescription drug management with ASA 81mg  aily, Plavix 75mg  daily, Lopressor 25mg  BID and statin -check Hbg since he is on DAPT   2.  HLD -LDL goal < 70 -check FLP and ALT -Continue prescription drug management with Atorvastatin 80mg  daily    Medication Adjustments/Labs and Tests Ordered: Current medicines are reviewed at length with the patient today.  Concerns regarding medicines are outlined above.  Tests Ordered: Orders Placed This Encounter  Procedures   EKG 12-Lead    Medication Changes: No orders of the defined types were placed in this encounter.   Disposition:  Follow up in 1 year(s)  Signed, 2017, MD  11/03/2020 8:14 AM    [facility]

## 2020-11-03 ENCOUNTER — Encounter: Payer: Self-pay | Admitting: Cardiology

## 2020-11-03 ENCOUNTER — Other Ambulatory Visit: Payer: Self-pay

## 2020-11-03 ENCOUNTER — Ambulatory Visit: Payer: BC Managed Care – PPO | Admitting: Cardiology

## 2020-11-03 VITALS — BP 126/86 | HR 60 | Ht 68.0 in | Wt 188.6 lb

## 2020-11-03 DIAGNOSIS — I2510 Atherosclerotic heart disease of native coronary artery without angina pectoris: Secondary | ICD-10-CM

## 2020-11-03 DIAGNOSIS — E785 Hyperlipidemia, unspecified: Secondary | ICD-10-CM | POA: Diagnosis not present

## 2020-11-03 LAB — CBC
Hematocrit: 39.9 % (ref 37.5–51.0)
Hemoglobin: 14 g/dL (ref 13.0–17.7)
MCH: 30.6 pg (ref 26.6–33.0)
MCHC: 35.1 g/dL (ref 31.5–35.7)
MCV: 87 fL (ref 79–97)
Platelets: 222 10*3/uL (ref 150–450)
RBC: 4.58 x10E6/uL (ref 4.14–5.80)
RDW: 11.9 % (ref 11.6–15.4)
WBC: 6 10*3/uL (ref 3.4–10.8)

## 2020-11-03 LAB — HEPATIC FUNCTION PANEL
ALT: 26 IU/L (ref 0–44)
AST: 20 IU/L (ref 0–40)
Albumin: 4 g/dL (ref 3.8–4.8)
Alkaline Phosphatase: 61 IU/L (ref 44–121)
Bilirubin Total: 0.5 mg/dL (ref 0.0–1.2)
Bilirubin, Direct: 0.16 mg/dL (ref 0.00–0.40)
Total Protein: 6.4 g/dL (ref 6.0–8.5)

## 2020-11-03 LAB — LIPID PANEL
Chol/HDL Ratio: 2.8 ratio (ref 0.0–5.0)
Cholesterol, Total: 116 mg/dL (ref 100–199)
HDL: 41 mg/dL (ref >39)
LDL Chol Calc (NIH): 64 mg/dL (ref 0–99)
Triglycerides: 48 mg/dL (ref 0–149)
VLDL Cholesterol Cal: 11 mg/dL (ref 5–40)

## 2020-11-03 MED ORDER — CLOPIDOGREL BISULFATE 75 MG PO TABS: ORAL_TABLET | ORAL | 3 refills | Status: DC

## 2020-11-03 MED ORDER — ATORVASTATIN CALCIUM 80 MG PO TABS: ORAL_TABLET | ORAL | 3 refills | Status: DC

## 2020-11-03 MED ORDER — METOPROLOL TARTRATE 25 MG PO TABS: 25.0000 mg | ORAL_TABLET | Freq: Two times a day (BID) | ORAL | 3 refills | Status: DC

## 2020-11-03 NOTE — Addendum Note (Signed)
Addended by: Daleen Bo I on: 11/03/2020 08:25 AM   Modules accepted: Orders

## 2020-11-03 NOTE — Patient Instructions (Signed)
Medication Instructions:  Your physician recommends that you continue on your current medications as directed. Please refer to the Current Medication list given to you today.  *If you need a refill on your cardiac medications before your next appointment, please call your pharmacy*   Lab Work: CBC, LFTS, LIPIDS  If you have labs (blood work) drawn today and your tests are completely normal, you will receive your results only by: MyChart Message (if you have MyChart) OR A paper copy in the mail If you have any lab test that is abnormal or we need to change your treatment, we will call you to review the results.   Testing/Procedures: None   Follow-Up: At [facility], you and your health needs are our priority.  As part of our continuing mission to provide you with exceptional heart care, we have created designated Provider Care Teams.  These Care Teams include your primary Cardiologist (physician) and Advanced Practice Providers (APPs -  Physician Assistants and Nurse Practitioners) who all work together to provide you with the care you need, when you need it.  We recommend signing up for the patient portal called "MyChart".  Sign up information is provided on this After Visit Summary.  MyChart is used to connect with patients for Virtual Visits (Telemedicine).  Patients are able to view lab/test results, encounter notes, upcoming appointments, etc.  Non-urgent messages can be sent to your provider as well.   To learn more about what you can do with MyChart, go to [email or web address].    Your next appointment:   1 year(s)  The format for your next appointment:   In Person  Provider:   Armanda Magic, MD   Other Instructions None

## 2021-03-22 IMAGING — CT CT ANGIO CHEST-ABD-PELV FOR DISSECTION W/ AND WO/W CM
2 of 9 series · 14 of 46 positions shown, 16 images · IV contrast (Omnipaque)
Comparison: 03/05/2015

CLINICAL DATA: Severe chest pain

EXAM:
CT ANGIOGRAPHY CHEST, ABDOMEN AND PELVIS
TECHNIQUE: Non-contrast CT of the chest was initially obtained.

[Series 5: axial arterial · axial · arterial · 0.79mm/px · z∈[+660,+1254]mm · 11 of 224 slices shown, 13 images]
[im 13/224  soft-tissue]
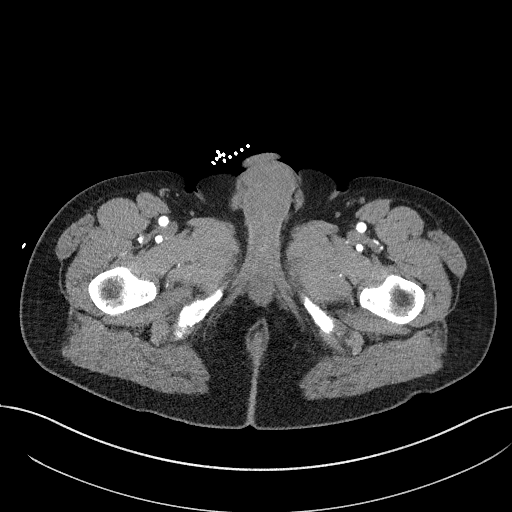
[im 13/224  bone]
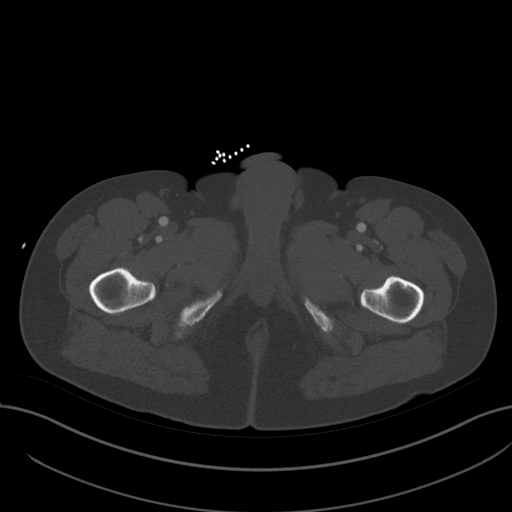
[im 38/224  soft-tissue]
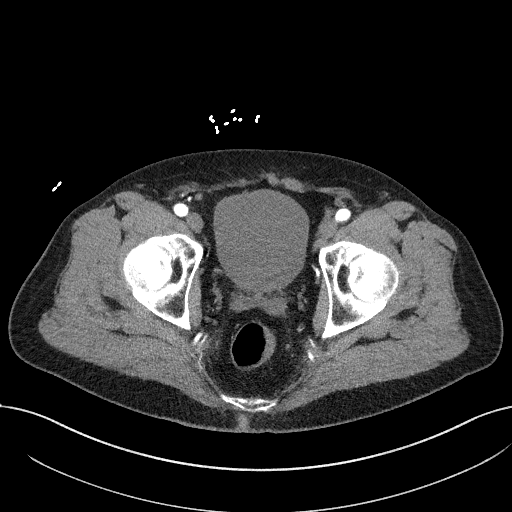
[im 50/224  soft-tissue]
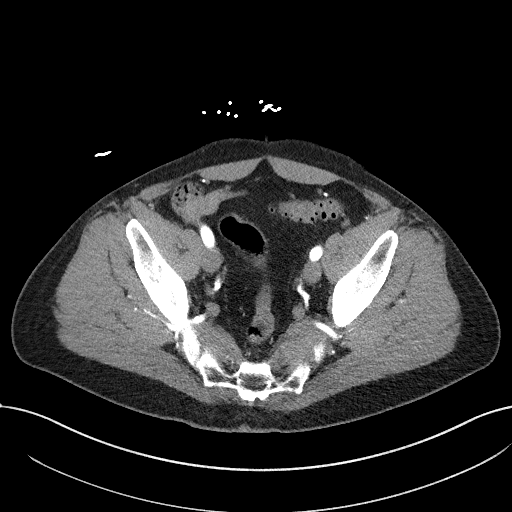
[im 75/224  soft-tissue]
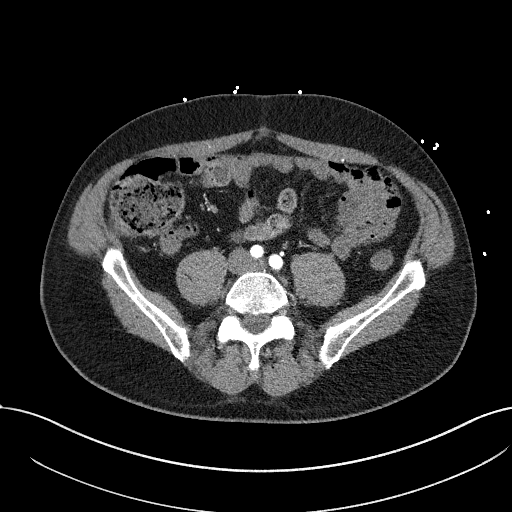
[im 87/224  soft-tissue]
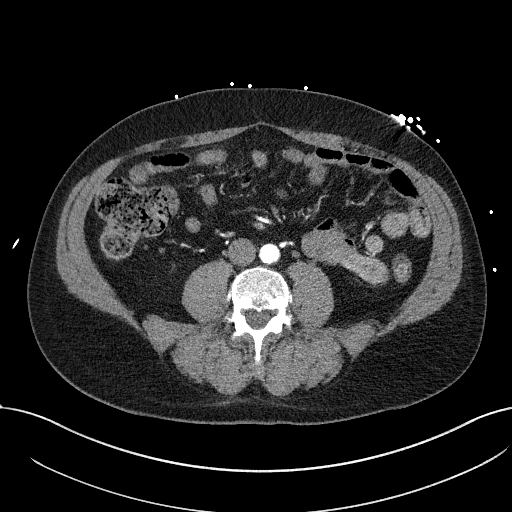
[im 112/224  soft-tissue]
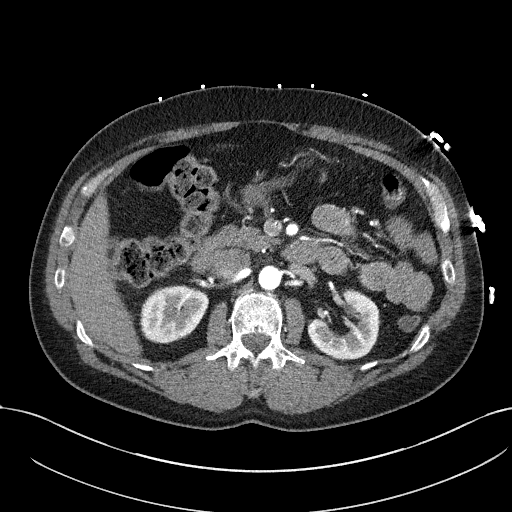
[im 137/224  soft-tissue]
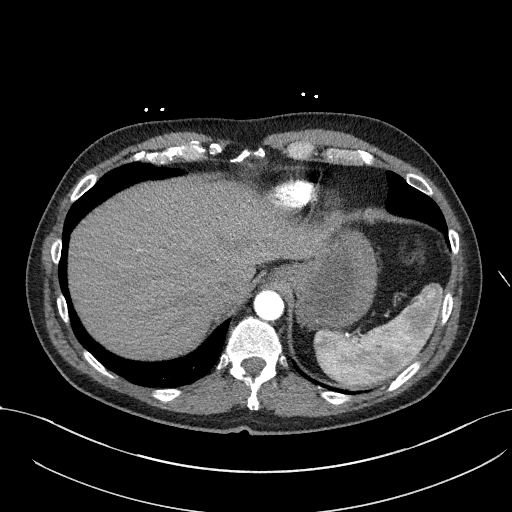
[im 149/224  soft-tissue]
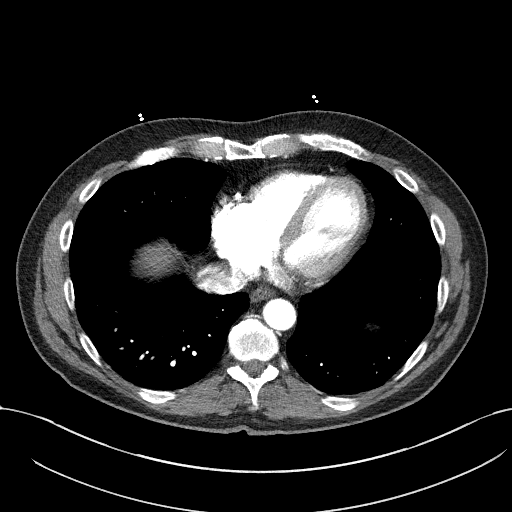
[im 174/224  soft-tissue]
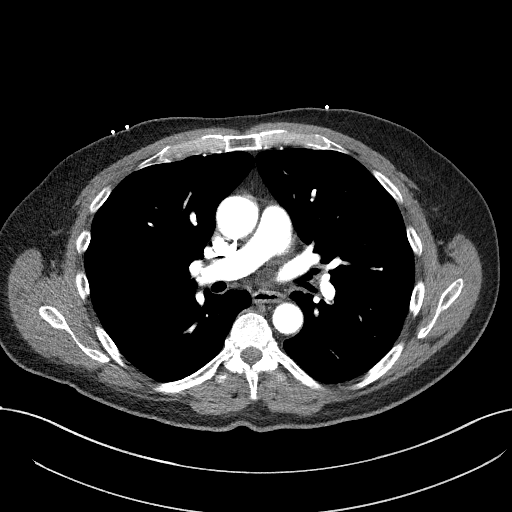
[im 174/224  bone]
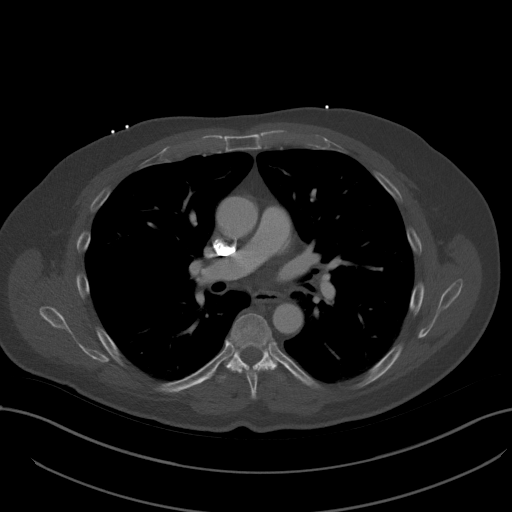
[im 186/224  soft-tissue]
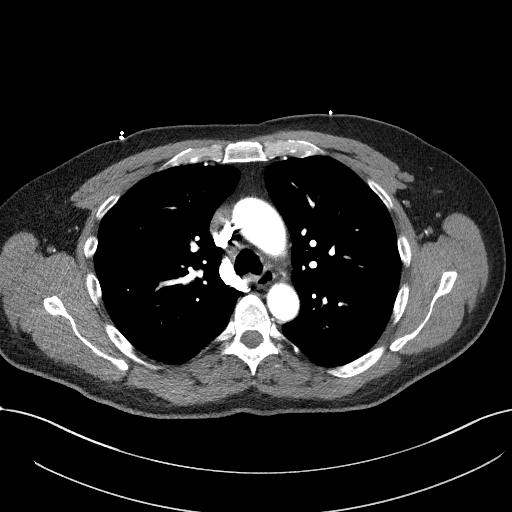
[im 211/224  soft-tissue]
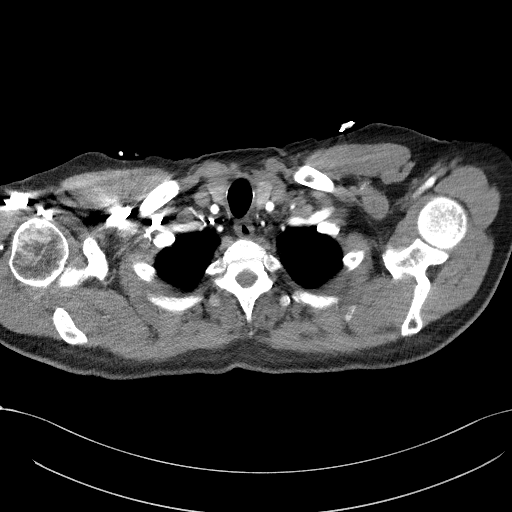

[Series 8: coronals · coronal · 1.00mm/px · 3 of 139 slices shown]
[im 35/139  soft-tissue]
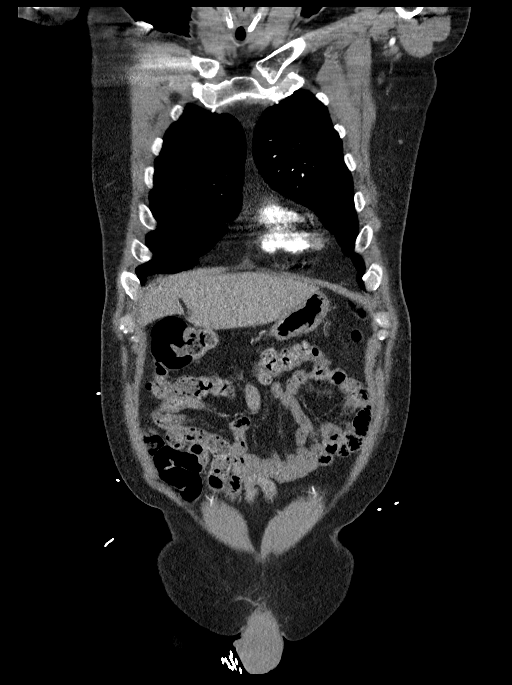
[im 70/139  soft-tissue]
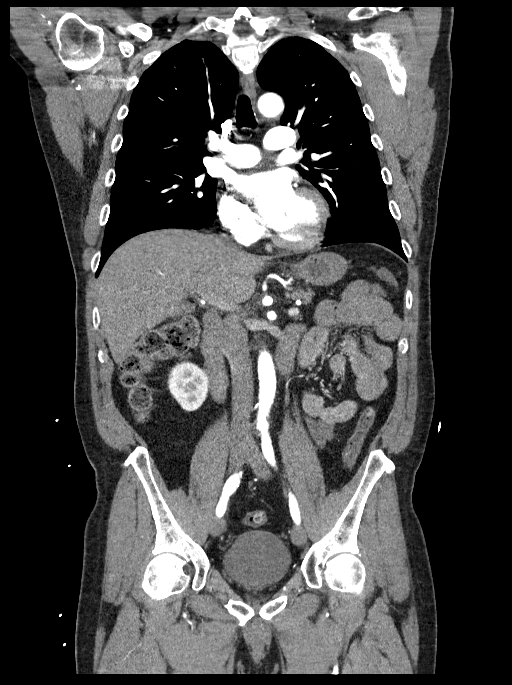
[im 104/139  soft-tissue]
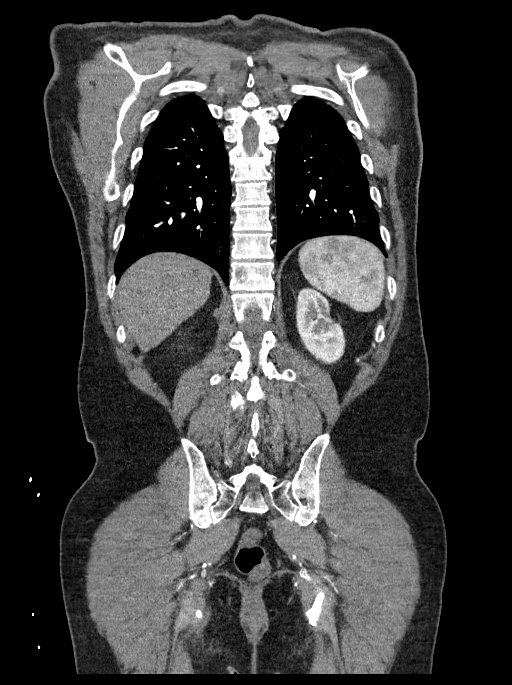

[14 of 46 positions shown; findings below may reference images not displayed]

Multidetector CT imaging through the chest, abdomen and pelvis was
performed using the standard protocol during bolus administration of
intravenous contrast. Multiplanar reconstructed images and MIPs were
obtained and reviewed to evaluate the vascular anatomy.

CONTRAST:  100mL OMNIPAQUE IOHEXOL 350 MG/ML SOLN
FINDINGS: CTA CHEST FINDINGS

Cardiovascular: Heart size normal. No pericardial effusion.
Satisfactory opacification of pulmonary arteries noted, and there is
no evidence of pulmonary emboli. Right coronary calcifications.
Adequate contrast opacification of the thoracic aorta with no
evidence of dissection, aneurysm, or stenosis. There is classic
3-vessel brachiocephalic arch anatomy without proximal stenosis. No
significant atheromatous change.

Mediastinum/Nodes: No mediastinal hematoma. No hilar or mediastinal
adenopathy.

Lungs/Pleura: No pleural effusion. No pneumothorax. Pulmonary
emphysema. No nodule or infiltrate.

Musculoskeletal: Anterior vertebral endplate spurring at multiple
levels in the lower thoracic spine. Bilateral shoulder DJD. No
fracture or worrisome bone lesion.

Review of the MIP images confirms the above findings.

CTA ABDOMEN AND PELVIS FINDINGS

VASCULAR

Aorta: Normal caliber aorta without aneurysm, dissection, vasculitis
or significant stenosis.

Celiac: Patent without evidence of aneurysm, dissection, vasculitis
or significant stenosis.

SMA: Patent without evidence of aneurysm, dissection, vasculitis or
significant stenosis.

Renals: On the left there are 3 renal arteries, the inferior
relatively diminutive, all patent. Single right renal artery,
patent.

IMA: Patent without evidence of aneurysm, dissection, vasculitis or
significant stenosis.

Inflow: Minimal calcified plaque in common iliac arteries. No
aneurysm, dissection, or stenosis.

Veins: No obvious venous abnormality within the limitations of this
arterial phase study.

Review of the MIP images confirms the above findings.

NON-VASCULAR

Hepatobiliary: No focal liver abnormality is seen. No gallstones,
gallbladder wall thickening, or biliary dilatation.

Pancreas: Unremarkable. No pancreatic ductal dilatation or
surrounding inflammatory changes.

Spleen: Negative. Heterogenous enhancement attributed to early
arterial phase timing.

Adrenals/Urinary Tract: Adrenal glands are unremarkable. Kidneys are
normal, without renal calculi, focal lesion, or hydronephrosis.
Bladder is unremarkable.

Stomach/Bowel: Stomach and small bowel decompressed. Previous
appendectomy. Moderate proximal colonic fecal material. A few
scattered descending and sigmoid diverticula without adjacent
inflammatory/edematous change or abscess.

Lymphatic: No abdominal or pelvic adenopathy.

Reproductive: Mild prostate enlargement with central calcifications.

Other: No ascites.  No free air.

Musculoskeletal: Mild bilateral hip and lumbar facet DJD. No
fracture or worrisome bone lesion.
IMPRESSION: 1. Negative for aortic dissection or acute PE.
2. Coronary calcifications. The severity of coronary artery disease
and any potential stenosis cannot be assessed on this non-gated CT
examination. Assessment for potential risk factor modification,
dietary therapy or pharmacologic therapy may be warranted, if
clinically indicated.
3.  Emphysema ([id number]).

## 2021-09-23 ENCOUNTER — Encounter: Payer: Self-pay | Admitting: Cardiology

## 2021-09-29 ENCOUNTER — Encounter: Payer: Self-pay | Admitting: Cardiology

## 2021-09-30 ENCOUNTER — Telehealth: Payer: Self-pay | Admitting: Cardiology

## 2021-09-30 NOTE — Telephone Encounter (Signed)
   Pre-operative Risk Assessment    Patient Name: Benjamin Hoffman  DOB: [birth year] MRN: [id number]      Request for Surgical Clearance    Procedure:  Hair Transplant   Date of Surgery:  Clearance 10/07/21                                 Surgeon:  Dr. Micki Riley  Surgeon's Group or Practice Name:  [facility]  Phone number:  [phone] Fax number:  office does not have fax  E-mail: [email or web address]    Type of Clearance Requested:   - Pharmacy:  Hold Clopidogrel (Plavix) 7 days    Type of Anesthesia:  Lidocaine    Additional requests/questions:    [name]   09/30/2021, 10:49 AM

## 2021-10-01 NOTE — Telephone Encounter (Signed)
   Patient Name: Benjamin Hoffman  DOB: 01-06-1956 MRN: [id number]  Primary Cardiologist: Fransico Him, MD  Chart reviewed as part of pre-operative protocol coverage.  Per Dr. Radford Pax patient is cleared to hold Plavix for 7 days prior to hair transplant procedure.   Mable Fill, Marissa Nestle, NP 10/01/2021, 3:32 PM

## 2021-10-01 NOTE — Telephone Encounter (Signed)
Clearance notes have been emailed to requesting office to email that has been provided in the clearance request.

## 2021-10-01 NOTE — Telephone Encounter (Signed)
Dr.Turner we have been asked to hold Plavix for 7days prior to his transplant. This is a 66 y.o. male with PMH of CAD (NSTEMI 2016 s/p DES to RCA), EF 55-60%. He was last seen on 10/2020 and was doing well with no new complaints. May he hold Plavix prior to procedure? Please route reply to preop team and thank you for your time.

## 2021-11-15 ENCOUNTER — Other Ambulatory Visit: Payer: Self-pay | Admitting: Cardiology

## 2021-11-21 ENCOUNTER — Other Ambulatory Visit: Payer: Self-pay | Admitting: Cardiology

## 2021-12-08 ENCOUNTER — Other Ambulatory Visit: Payer: Self-pay | Admitting: Cardiology

## 2021-12-29 ENCOUNTER — Other Ambulatory Visit: Payer: Self-pay | Admitting: Cardiology

## 2021-12-31 ENCOUNTER — Other Ambulatory Visit: Payer: Self-pay | Admitting: Cardiology

## 2022-01-16 ENCOUNTER — Other Ambulatory Visit: Payer: Self-pay | Admitting: Cardiology

## 2022-01-18 NOTE — Progress Notes (Signed)
Cardiology Office Note:    Date:  01/25/2022   ID:  Benjamin Hoffman, DOB 1956/04/24, MRN [id number]  PCP:  Maylon Peppers, MD  [facility] Cardiologist:  Fransico Him, MD     Referring MD: Maylon Peppers, MD   Chief Complaint:  Pre-op Exam     History of Present Illness:   Benjamin Hoffman is a 66 y.o. male with  history of CAD NSTEMI 2016 DES RCA, normal LVEF on plavix and ASA, HLD, HTN  Last saw Dr. Radford Pax 10/2020 and doing well.  Patient is on my schedule for regular check up. Had hair transplant in June. Denies chest pain. Occasional shortness of breath when he gets up but very rare. Walks 30 min twice a week. Has gained weight. Works at a [employer] and sits a lot. BP up today. BP at home but usually normal-115-130/83-90.    Past Medical History:  Diagnosis Date   CAD (coronary artery disease), native coronary artery 03/2015   a. NSTEMI 2016 s/p DES to RCA.   Hyperlipidemia LDL goal <70    NSTEMI (non-ST elevated myocardial infarction) ([place]) 03/2015   S/P angioplasty with stent 03/2015   to RCA, with DES   Current Medications: Current Meds  Medication Sig   aspirin EC 81 MG tablet Take 1 tablet (81 mg total) by mouth daily.   atorvastatin (LIPITOR) 80 MG tablet TAKE 1 TABLET(80 MG) BY MOUTH DAILY   clopidogrel (PLAVIX) 75 MG tablet TAKE 1 TABLET DAILY   finasteride (PROSCAR) 5 MG tablet Take 2.5 mg by mouth daily.   metoprolol tartrate (LOPRESSOR) 25 MG tablet TAKE 1 TABLET TWICE A DAY   nitroGLYCERIN (NITROSTAT) 0.4 MG SL tablet DISSOLVE 1 TABLET UNDER THE TONGUE EVERY 5 MINUTES FOR A TOTAL OF 3 DOSES AS NEEDED FOR CHEST PAIN    Allergies:   Penicillins   Social History   Tobacco Use   Smoking status: Former    Packs/day: 1.00    Years: 30.00    Total pack years: 30.00    Types: Cigarettes    Quit date: 03/04/2009    Years since quitting: 12.9   Smokeless tobacco: Never  Vaping Use   Vaping Use: Never used  Substance Use Topics   Alcohol use: No    Drug use: No    Family Hx: The patient's family history includes CVA in his father; Diverticulitis in his brother; Heart attack (age of onset: 2) in his maternal grandfather; Heart disease in his maternal grandfather; Hypertension in his father.  ROS   EKGs/Labs/Other Test Reviewed:    EKG:  EKG is   ordered today.  The ekg ordered today demonstrates NSR, nonspecific ST changes, no acute change  Recent Labs: No results found for requested labs within last 365 days.   Recent Lipid Panel No results for input(s): "CHOL", "TRIG", "HDL", "VLDL", "LDLCALC", "LDLDIRECT" in the last 8760 hours.   Prior CV Studies: LEFT HEART CATH AND CORONARY ANGIOGRAPHY, LEFT HEART CATH AND CORONARY ANGIOGRAPHY 03/06/2015  Narrative Images from the original result were not included. 1. Prox RCA to Mid RCA lesion, 100% stenosed. Post intervention, there is a 0% residual stenosis. 2. RPDA lesion, 100% stenosed - distal embolization. There is faint TIMI 1 flow to the apical segment with competetive flow from the LAD collaterals 3. The left ventricular systolic function is normal. 4. Normal LVEDP   Silent occlusion of the proximal RCA with diffuse disease following the initial occlusion. There is actually a tandem  90 and 80% stenosis after the original 100% stenosis. These were covered with a single Promus Premier DES stent postdilated to 3.3 mm.  Thankfully he had preserved LVEF with normal LVEDP.  Recommendations:  He will be transferred to the post procedure unit for monitoring overnight. Standard TR band removal per protocol.  Angiomax confusion will complete once bag is complete.  IV nitroglycerin to be tapered off overnight.  Add beta blocker-metoprolol 25 mg twice a day  Continue statin along with aspirin and Brilinta.  Would be okay to stop aspirin after 3 months if necessary,. Provided used with Brilinta.. Otherwise continue Brilinta for over one year.  Expected discharge likely tomorrow  if stable. He will follow up with Dr. Fransico Him.   Leonie Man, M.D., M.S. Interventional Cardiologist  Pager # [phone]   ECHO COMPLETE WO IMAGING ENHANCING AGENT 03/06/2015  Narrative *[facility]* [address], [place] [address] [phone]  ------------------------------------------------------------------- Transthoracic Echocardiography  Patient:    Benjamin Hoffman, Benjamin Hoffman MR #:       [id number] Study Date: 03/06/2015 Gender:     M Age:        58 Height:     172.7 cm Weight:     86.6 kg BSA:        2.06 m^2 Pt. Status: Room:       [place], MD [place], [place] SONOGRAPHER  Johny Chess, RDCS, CCT PERFORMING   [facility], Inpatient  cc:  ------------------------------------------------------------------- LV EF: 55% -   60%  ------------------------------------------------------------------- Indications:      MI - acute 410.91.  ------------------------------------------------------------------- Study Conclusions  - Left ventricle: The cavity size was normal. Wall thickness was normal. Systolic function was normal. The estimated ejection fraction was in the range of 55% to 60%. Wall motion was normal; there were no regional wall motion abnormalities. Left ventricular diastolic function parameters were normal.  Transthoracic echocardiography.  M-mode, complete 2D, spectral Doppler, and color Doppler.  Birthdate:  Patient birthdate: 02/20/1956.  Age:  Patient is 66 yr old.  Sex:  Gender: male. BMI: 29 kg/m^2.  Blood pressure:     160/95  Patient status: Inpatient.  Study date:  Study date: 03/06/2015. Study time: 04:19 PM.  Location:  Bedside.  -------------------------------------------------------------------  ------------------------------------------------------------------- Left ventricle:  The cavity size was normal. Wall thickness  was normal. Systolic function was normal. The estimated ejection fraction was in the range of 55% to 60%. Wall motion was normal; there were no regional wall motion abnormalities. The transmitral flow pattern was normal. The deceleration time of the early transmitral flow velocity was normal. The pulmonary vein flow pattern was normal. The tissue Doppler parameters were normal. Left ventricular diastolic function parameters were normal.  ------------------------------------------------------------------- Aortic valve:   Structurally normal valve.   Cusp separation was normal.  Doppler:  Transvalvular velocity was within the normal range. There was no stenosis. There was no regurgitation.  ------------------------------------------------------------------- Aorta:  The aorta was normal, not dilated, and non-diseased.  ------------------------------------------------------------------- Mitral valve:   Structurally normal valve.   Leaflet separation was normal.  Doppler:  Transvalvular velocity was within the normal range. There was no evidence for stenosis. There was no regurgitation.    Peak gradient (D): 3 mm Hg.  ------------------------------------------------------------------- Left atrium:  The atrium was normal in size.  ------------------------------------------------------------------- Right ventricle:  The cavity size was normal. Wall thickness was normal. Systolic  function was normal.  ------------------------------------------------------------------- Pulmonic valve:   Poorly visualized.  ------------------------------------------------------------------- Tricuspid valve:   Structurally normal valve.   Leaflet separation was normal.  Doppler:  Transvalvular velocity was within the normal range. There was no regurgitation.  ------------------------------------------------------------------- Right atrium:  The atrium was normal in  size.  ------------------------------------------------------------------- Pericardium:  The pericardium was normal in appearance.  ------------------------------------------------------------------- Post procedure conclusions Ascending Aorta:  - The aorta was normal, not dilated, and non-diseased.  ------------------------------------------------------------------- Measurements  Left ventricle                         Value        Reference LV ID, ED, PLAX chordal                45.3  mm     43 - 52 LV ID, ES, PLAX chordal                28.7  mm     23 - 38 LV fx shortening, PLAX chordal         37    %      >=29 LV PW thickness, ED                    11.8  mm     --------- IVS/LV PW ratio, ED                    0.85         <=1.3 LV e&', lateral                         13.8  cm/s   --------- LV E/e&', lateral                       6.57         --------- LV e&', medial                          12.2  cm/s   --------- LV E/e&', medial                        7.43         --------- LV e&', average                         13    cm/s   --------- LV E/e&', average                       6.98         ---------  Ventricular septum                     Value        Reference IVS thickness, ED                      10    mm     ---------  LVOT                                   Value        Reference LVOT ID, S  22    mm     --------- LVOT area                              3.8   cm^2   ---------  Aorta                                  Value        Reference Aortic root ID, ED                     36    mm     ---------  Left atrium                            Value        Reference LA ID, A-P, ES                         40    mm     --------- LA ID/bsa, A-P                         1.94  cm/m^2 <=2.2 LA volume, S                           41.5  ml     --------- LA volume/bsa, S                       20.1  ml/m^2 --------- LA volume, ES, 1-p A4C                 37.4   ml     --------- LA volume/bsa, ES, 1-p A4C             18.2  ml/m^2 --------- LA volume, ES, 1-p A2C                 45.1  ml     --------- LA volume/bsa, ES, 1-p A2C             21.9  ml/m^2 ---------  Mitral valve                           Value        Reference Mitral E-wave peak velocity            90.7  cm/s   --------- Mitral A-wave peak velocity            72.3  cm/s   --------- Mitral deceleration time       (H)     246   ms     150 - 230 Mitral peak gradient, D                3     mm Hg  --------- Mitral E/A ratio, peak                 1.3          ---------  Systemic veins                         Value  Reference Estimated CVP                          3     mm Hg  ---------  Right ventricle                        Value        Reference RV s&', lateral, S                      15.3  cm/s   ---------  Legend: (L)  and  (H)  Khamani values outside specified reference range.  ------------------------------------------------------------------- Prepared and Electronically Authenticated by  Darlin Coco, MD 2016-11-04T17:21:47         Risk Assessment/Calculations/Metrics:         HYPERTENSION CONTROL Vitals:   01/25/22 1143 01/25/22 1200  BP: (!) 132/90 (!) 140/95    The patient's blood pressure is elevated above target today.  In order to address the patient's elevated BP: Blood pressure will be monitored at home to determine if medication changes need to be made.       Physical Exam:    VS:  BP (!) 140/95   Pulse 60   Ht 5\' 8"  (1.727 m)   Wt 195 lb (88.5 kg)   BMI 29.65 kg/m     Wt Readings from Last 3 Encounters:  01/25/22 195 lb (88.5 kg)  11/03/20 188 lb 9.6 oz (85.5 kg)  10/24/19 171 lb (77.6 kg)    Physical Exam  GEN: Well nourished, well developed, in no acute distress  Neck: no JVD, carotid bruits, or masses Cardiac:RRR; no murmurs, rubs, or gallops  Respiratory:  clear to auscultation bilaterally, normal work of breathing GI:  soft, nontender, nondistended, + BS Ext: without cyanosis, clubbing, or edema, Good distal pulses bilaterally Neuro:  Alert and Oriented x 3,  Psych: euthymic mood, full affect       ASSESSMENT & PLAN:   No problem-specific Assessment & Plan notes found for this encounter.    CAD NSTEMI 2016 DES RCA, normal LVEF on plavix and ASA-no angina or bleeding problems. 150 min exercise weekly. Check labs  HLD on lipitor-check labs today  HTN-BP up today on both checks. Patient has gained weight. Is wanting to watch diet, exercise and weight loss. He'll keep daily track and send [place] results in 2 weeks. May consider amlodipine or losartan if still elevated.          Dispo:  No follow-ups on file.   Medication Adjustments/Labs and Tests Ordered: Current medicines are reviewed at length with the patient today.  Concerns regarding medicines are outlined above.  Tests Ordered: Orders Placed This Encounter  Procedures   CBC   Comprehensive metabolic panel   TSH   Lipid panel   EKG 12-Lead   Medication Changes: No orders of the defined types were placed in this encounter.  Sumner Boast, PA-C  01/25/2022 12:09 PM    [address], [place], [place]  [address] Phone: [phone]; Fax: [phone]

## 2022-01-25 ENCOUNTER — Encounter: Payer: Self-pay | Admitting: Physician Assistant

## 2022-01-25 ENCOUNTER — Ambulatory Visit: Payer: BC Managed Care – PPO | Attending: Cardiology | Admitting: Physician Assistant

## 2022-01-25 VITALS — BP 140/95 | HR 60 | Ht 68.0 in | Wt 195.0 lb

## 2022-01-25 DIAGNOSIS — I10 Essential (primary) hypertension: Secondary | ICD-10-CM

## 2022-01-25 DIAGNOSIS — E782 Mixed hyperlipidemia: Secondary | ICD-10-CM

## 2022-01-25 DIAGNOSIS — I2510 Atherosclerotic heart disease of native coronary artery without angina pectoris: Secondary | ICD-10-CM

## 2022-01-25 NOTE — Patient Instructions (Addendum)
Medication Instructions:  Your physician recommends that you continue on your current medications as directed. Please refer to the Current Medication list given to you today.  *If you need a refill on your cardiac medications before your next appointment, please call your pharmacy*   Lab Work: Lipids, Cbc, Cmp, Tsh - today   If you have labs (blood work) drawn today and your tests are completely normal, you will receive your results only by: MyChart Message (if you have MyChart) OR A paper copy in the mail If you have any lab test that is abnormal or we need to change your treatment, we will call you to review the results.   Testing/Procedures: None ordered    Follow-Up: At [facility], you and your health needs are our priority.  As part of our continuing mission to provide you with exceptional heart care, we have created designated Provider Care Teams.  These Care Teams include your primary Cardiologist (physician) and Advanced Practice Providers (APPs -  Physician Assistants and Nurse Practitioners) who all work together to provide you with the care you need, when you need it.  We recommend signing up for the patient portal called "MyChart".  Sign up information is provided on this After Visit Summary.  MyChart is used to connect with patients for Virtual Visits (Telemedicine).  Patients are able to view lab/test results, encounter notes, upcoming appointments, etc.  Non-urgent messages can be sent to your provider as well.   To learn more about what you can do with MyChart, go to [email or web address].    Your next appointment:   12 month(s)  The format for your next appointment:   In Person  Provider:   Armanda Magic, MD     Other Instructions Two Gram Sodium Diet 2000 mg  What is Sodium? Sodium is a mineral found naturally in many foods. The most significant source of sodium in the diet is table salt, which is about 40% sodium.  Processed, convenience, and  preserved foods also contain a large amount of sodium.  The body needs only 500 mg of sodium daily to function,  A normal diet provides more than enough sodium even if you do not use salt.  Why Limit Sodium? A build up of sodium in the body can cause thirst, increased blood pressure, shortness of breath, and water retention.  Decreasing sodium in the diet can reduce edema and risk of heart attack or stroke associated with high blood pressure.  Keep in mind that there are many other factors involved in these health problems.  Heredity, obesity, lack of exercise, cigarette smoking, stress and what you eat all play a role.  General Guidelines: Do not add salt at the table or in cooking.  One teaspoon of salt contains over 2 grams of sodium. Read food labels Avoid processed and convenience foods Ask your dietitian before eating any foods not dicussed in the menu planning guidelines Consult your physician if you wish to use a salt substitute or a sodium containing medication such as antacids.  Limit milk and milk products to 16 oz (2 cups) per day.  Shopping Hints: READ LABELS!! "Dietetic" does not necessarily mean low sodium. Salt and other sodium ingredients are often added to foods during processing.    Menu Planning Guidelines Food Group Choose More Often Avoid  Beverages (see also the milk group All fruit juices, low-sodium, salt-free vegetables juices, low-sodium carbonated beverages Regular vegetable or tomato juices, commercially softened water used for drinking or cooking  Breads and Cereals Enriched white, wheat, rye and pumpernickel bread, hard rolls and dinner rolls; muffins, cornbread and waffles; most dry cereals, cooked cereal without added salt; unsalted crackers and breadsticks; low sodium or homemade bread crumbs Bread, rolls and crackers with salted tops; quick breads; instant hot cereals; pancakes; commercial bread stuffing; self-rising flower and biscuit mixes; regular bread  crumbs or cracker crumbs  Desserts and Sweets Desserts and sweets mad with mild should be within allowance Instant pudding mixes and cake mixes  Fats Butter or margarine; vegetable oils; unsalted salad dressings, regular salad dressings limited to 1 Tbs; light, sour and heavy cream Regular salad dressings containing bacon fat, bacon bits, and salt pork; snack dips made with instant soup mixes or processed cheese; salted nuts  Fruits Most fresh, frozen and canned fruits Fruits processed with salt or sodium-containing ingredient (some dried fruits are processed with sodium sulfites        Vegetables Fresh, frozen vegetables and low- sodium canned vegetables Regular canned vegetables, sauerkraut, pickled vegetables, and others prepared in brine; frozen vegetables in sauces; vegetables seasoned with ham, bacon or salt pork  Condiments, Sauces, Miscellaneous  Salt substitute with physician's approval; pepper, herbs, spices; vinegar, lemon or lime juice; hot pepper sauce; garlic powder, onion powder, low sodium soy sauce (1 Tbs.); low sodium condiments (ketchup, chili sauce, mustard) in limited amounts (1 tsp.) fresh ground horseradish; unsalted tortilla chips, pretzels, potato chips, popcorn, salsa (1/4 cup) Any seasoning made with salt including garlic salt, celery salt, onion salt, and seasoned salt; sea salt, rock salt, kosher salt; meat tenderizers; monosodium glutamate; mustard, regular soy sauce, barbecue, sauce, chili sauce, teriyaki sauce, steak sauce, Worcestershire sauce, and most flavored vinegars; canned gravy and mixes; regular condiments; salted snack foods, olives, picles, relish, horseradish sauce, catsup   Food preparation: Try these seasonings Meats:    Pork Sage, onion Serve with applesauce  Chicken Poultry seasoning, thyme, parsley Serve with cranberry sauce  Lamb Curry powder, rosemary, garlic, thyme Serve with mint sauce or jelly  Veal Marjoram, basil Serve with current jelly,  cranberry sauce  Beef Pepper, bay leaf Serve with dry mustard, unsalted chive butter  Fish [place] leaf, dill Serve with unsalted lemon butter, unsalted parsley butter  Vegetables:    Asparagus Lemon juice   Broccoli Lemon juice   Carrots Mustard dressing parsley, mint, nutmeg, glazed with unsalted butter and sugar   Green beans Marjoram, lemon juice, nutmeg,dill seed   Tomatoes Basil, marjoram, onion   Spice /blend for [employer]" 4 tsp ground thyme 1 tsp ground sage 3 tsp ground rosemary 4 tsp ground marjoram   Test your knowledge A product that says "Salt Free" may still contain sodium. True or False Garlic Powder and Hot Pepper Sauce an be used as alternative seasonings.True or False Processed foods have more sodium than fresh foods.  True or False Canned Vegetables have less sodium than froze True or False   WAYS TO DECREASE YOUR SODIUM INTAKE Avoid the use of added salt in cooking and at the table.  Table salt (and other prepared seasonings which contain salt) is probably one of the greatest sources of sodium in the diet.  Unsalted foods can gain flavor from the sweet, sour, and butter taste sensations of herbs and spices.  Instead of using salt for seasoning, try the following seasonings with the foods listed.  Remember: how you use them to enhance natural food flavors is limited only by your creativity... Allspice-Meat, fish, eggs, fruit, peas, red and  yellow vegetables Almond Extract-Fruit baked goods Anise Seed-Sweet breads, fruit, carrots, beets, cottage cheese, cookies (tastes like licorice) Basil-Meat, fish, eggs, vegetables, rice, vegetables salads, soups, sauces Bay Leaf-Meat, fish, stews, poultry Burnet-Salad, vegetables (cucumber-like flavor) Caraway Seed-Bread, cookies, cottage cheese, meat, vegetables, cheese, rice Cardamon-Baked goods, fruit, soups Celery Powder or seed-Salads, salad dressings, sauces, meatloaf, soup, bread.Do not use  celery salt Chervil-Meats,  salads, fish, eggs, vegetables, cottage cheese (parsley-like flavor) Chili Power-Meatloaf, chicken cheese, corn, eggplant, egg dishes Chives-Salads cottage cheese, egg dishes, soups, vegetables, sauces Cilantro-Salsa, casseroles Cinnamon-Baked goods, fruit, pork, lamb, chicken, carrots Cloves-Fruit, baked goods, fish, pot roast, green beans, beets, carrots Coriander-Pastry, cookies, meat, salads, cheese (lemon-orange flavor) Cumin-Meatloaf, fish,cheese, eggs, cabbage,fruit pie (caraway flavor) [employer], fruit, eggs, fish, poultry, cottage cheese, vegetables Dill Seed-Meat, cottage cheese, poultry, vegetables, fish, salads, bread Fennel Seed-Bread, cookies, apples, pork, eggs, fish, beets, cabbage, cheese, Licorice-like flavor Garlic-(buds or powder) Salads, meat, poultry, fish, bread, butter, vegetables, potatoes.Do not  use garlic salt Ginger-Fruit, vegetables, baked goods, meat, fish, poultry Horseradish Root-Meet, vegetables, butter Lemon Juice or Extract-Vegetables, fruit, tea, baked goods, fish salads Mace-Baked goods fruit, vegetables, fish, poultry (taste like nutmeg) Maple Extract-Syrups Marjoram-Meat, chicken, fish, vegetables, breads, green salads (taste like Sage) Mint-Tea, lamb, sherbet, vegetables, desserts, carrots, cabbage Mustard, Dry or Seed-Cheese, eggs, meats, vegetables, poultry Nutmeg-Baked goods, fruit, chicken, eggs, vegetables, desserts Onion Powder-Meat, fish, poultry, vegetables, cheese, eggs, bread, rice salads (Do not use   Onion salt) Orange Extract-Desserts, baked goods Oregano-Pasta, eggs, cheese, onions, pork, lamb, fish, chicken, vegetables, green salads Paprika-Meat, fish, poultry, eggs, cheese, vegetables Parsley Flakes-Butter, vegetables, meat fish, poultry, eggs, bread, salads (certain forms may   Contain sodium Pepper-Meat fish, poultry, vegetables, eggs Peppermint Extract-Desserts, baked goods Poppy Seed-Eggs, bread, cheese, fruit  dressings, baked goods, noodles, vegetables, cottage  [employer], poultry, meat, fish, cauliflower, turnips,eggs bread Saffron-Rice, bread, veal, chicken, fish, eggs Sage-Meat, fish, poultry, onions, eggplant, tomateos, pork, stews Savory-Eggs, salads, poultry, meat, rice, vegetables, soups, pork Tarragon-Meat, poultry, fish, eggs, butter, vegetables (licorice-like flavor)  Thyme-Meat, poultry, fish, eggs, vegetables, (clover-like flavor), sauces, soups Tumeric-Salads, butter, eggs, fish, rice, vegetables (saffron-like flavor) Vanilla Extract-Baked goods, candy Vinegar-Salads, vegetables, meat marinades Walnut Extract-baked goods, candy   2. Choose your Foods Wisely   The following is a list of foods to avoid which are high in sodium:  Meats-Avoid all smoked, canned, salt cured, dried and kosher meat and fish as well as Anchovies   [employer] meats:Bologna, Liverwurst, Pastrami Canned meat or fish  Marinated herring Caviar    Pepperoni Corned Beef   Pizza Dried chipped beef  Salami Frozen breaded fish or meat Salt pork Frankfurters or hot dogs  Sardines Gefilte fish   Sausage Ham (boiled ham, Proscuitto Smoked butt    spiced ham)   Spam      TV Dinners Vegetables Canned vegetables (Regular) Relish Canned mushrooms  Sauerkraut Olives    Tomato juice Pickles  Bakery and Dessert Products Canned puddings  Cream pies Cheesecake   Decorated cakes Cookies  Beverages/Juices Tomato juice, regular  Gatorade   V-8 vegetable juice, regular  Breads and Cereals Biscuit mixes   Salted potato chips, corn chips, pretzels Bread stuffing mixes  Salted crackers and rolls Pancake and waffle mixes Self-rising flour  Seasonings Accent    Meat sauces Barbecue sauce  Meat tenderizer Catsup    Monosodium glutamate (MSG) Celery salt   Onion salt Chili sauce   Prepared mustard Garlic salt  Salt, seasoned salt, sea salt Gravy  mixes   Soy sauce Horseradish   Steak sauce Ketchup   Tartar sauce Lite salt    Teriyaki sauce Marinade mixes   Worcestershire sauce  Others Baking powder   Cocoa and cocoa mixes Baking soda   Commercial casserole mixes Candy-caramels, chocolate  Dehydrated soups    Bars, fudge,nougats  Instant rice and pasta mixes Canned broth or soup  Maraschino cherries Cheese, aged and processed cheese and cheese spreads  Learning Assessment Quiz  Indicated T (for True) or F (for False) for each of the following statements:  _____ Fresh fruits and vegetables and unprocessed grains are generally low in sodium _____ Water may contain a considerable amount of sodium, depending on the source _____ You can always tell if a food is high in sodium by tasting it _____ Certain laxatives my be high in sodium and should be avoided unless prescribed   by a physician or pharmacist _____ Salt substitutes may be used freely by anyone on a sodium restricted diet _____ Sodium is present in table salt, food additives and as a natural component of   most foods _____ Table salt is approximately 90% sodium _____ Limiting sodium intake may help prevent excess fluid accumulation in the body _____ On a sodium-restricted diet, seasonings such as bouillon soy sauce, and    cooking wine should be used in place of table salt _____ On an ingredient list, a product which lists monosodium glutamate as the first   ingredient is an appropriate food to include on a low sodium diet  Circle the best answer(s) to the following statements (Hint: there may be more than one correct answer)  11. On a low-sodium diet, some acceptable snack items are:    A. Olives  F. Bean dip   K. Grapefruit juice    B. Salted Pretzels G. Commercial Popcorn   L. Canned peaches    C. Carrot Sticks  H. Bouillon   M. Unsalted nuts   D. [place] fries  I. Peanut butter crackers N. Salami   E. Sweet pickles J. Tomato Juice   O. Pizza  12.  Seasonings  that may be used freely on a reduced - sodium diet include   A. Lemon wedges F.Monosodium glutamate K. Celery seed    B.Soysauce   G. Pepper   L. Mustard powder   C. Sea salt  H. Cooking wine  M. Onion flakes   D. Vinegar  E. Prepared horseradish N. Salsa   E. Sage   J. Worcestershire sauce  O. Chutney

## 2022-01-26 LAB — LIPID PANEL
Chol/HDL Ratio: 3 ratio (ref 0.0–5.0)
Cholesterol, Total: 118 mg/dL (ref 100–199)
HDL: 40 mg/dL (ref >39)
LDL Chol Calc (NIH): 64 mg/dL (ref 0–99)
Triglycerides: 66 mg/dL (ref 0–149)
VLDL Cholesterol Cal: 14 mg/dL (ref 5–40)

## 2022-01-26 LAB — CBC
Hematocrit: 41 % (ref 37.5–51.0)
Hemoglobin: 14.6 g/dL (ref 13.0–17.7)
MCH: 30.9 pg (ref 26.6–33.0)
MCHC: 35.6 g/dL (ref 31.5–35.7)
MCV: 87 fL (ref 79–97)
Platelets: 162 10*3/uL (ref 150–450)
RBC: 4.73 x10E6/uL (ref 4.14–5.80)
RDW: 11.8 % (ref 11.6–15.4)
WBC: 5.9 10*3/uL (ref 3.4–10.8)

## 2022-01-26 LAB — COMPREHENSIVE METABOLIC PANEL
ALT: 35 IU/L (ref 0–44)
AST: 31 IU/L (ref 0–40)
Albumin/Globulin Ratio: 1.8 (ref 1.2–2.2)
Albumin: 4.2 g/dL (ref 3.9–4.9)
Alkaline Phosphatase: 72 IU/L (ref 44–121)
BUN/Creatinine Ratio: 17 (ref 10–24)
BUN: 16 mg/dL (ref 8–27)
Bilirubin Total: 0.5 mg/dL (ref 0.0–1.2)
CO2: 23 mmol/L (ref 20–29)
Calcium: 9.3 mg/dL (ref 8.6–10.2)
Chloride: 102 mmol/L (ref 96–106)
Creatinine, Ser: 0.94 mg/dL (ref 0.76–1.27)
Globulin, Total: 2.4 g/dL (ref 1.5–4.5)
Glucose: 100 mg/dL — ABNORMAL HIGH (ref 70–99)
Potassium: 4.1 mmol/L (ref 3.5–5.2)
Sodium: 139 mmol/L (ref 134–144)
Total Protein: 6.6 g/dL (ref 6.0–8.5)
eGFR: 91 mL/min/{1.73_m2} (ref >59)

## 2022-01-26 LAB — TSH: TSH: 1.45 u[IU]/mL (ref 0.450–4.500)

## 2022-02-12 ENCOUNTER — Other Ambulatory Visit: Payer: Self-pay | Admitting: Cardiology

## 2022-02-25 ENCOUNTER — Other Ambulatory Visit: Payer: Self-pay | Admitting: Cardiology

## 2022-05-23 ENCOUNTER — Ambulatory Visit: Payer: BC Managed Care – PPO | Admitting: Cardiology

## 2023-02-03 ENCOUNTER — Other Ambulatory Visit: Payer: Self-pay | Admitting: Cardiology

## 2023-02-08 ENCOUNTER — Encounter (HOSPITAL_BASED_OUTPATIENT_CLINIC_OR_DEPARTMENT_OTHER): Payer: Self-pay | Admitting: Cardiology

## 2023-05-03 NOTE — Progress Notes (Signed)
 Cardiology Office Note    Patient Name: Benjamin Hoffman Date of Encounter: 05/03/2023  Primary Care Provider:  Lavell Ewing SAILOR, MD Primary Cardiologist:  Wilbert Bihari, MD Primary Electrophysiologist: None   Past Medical History    Past Medical History:  Diagnosis Date   CAD (coronary artery disease), native coronary artery 03/2015   a. NSTEMI 2016 s/p DES to RCA.   Hyperlipidemia LDL goal <70    NSTEMI (non-ST elevated myocardial infarction) (HCC) 03/2015   S/P angioplasty with stent 03/2015   to RCA, with DES    History of Present Illness  Benjamin Hoffman is a 68 y.o. male with a PMH of CAD s/p NSTEMI with PCI/DES with Promus Premier stent to 100% occluded RCA, HLD, HTN who presents today for 1 year follow-up.  Mr. Sahli was seen initially in 2016 when he was admitted with CP, fatigue and shortness of breath and underwent ACS workup and found to have NSTEMI.  LHC was completed with PCI/DES to RCA with Promus Premier stent was placed on 12 months of Brilinta  and ASA.  He is currently on Plavix  and ASA twilight trial.  He was last seen by Dr. Bihari on 10/2020 and was doing well at that time.  He underwent hair transplants in 2023 and was last seen by Rosaline Pavy, PA on 12/2021.  During visit he reported walking 30 minutes twice a week and reported occasional shortness of breath with ambulation.  His blood pressures during visit were elevated and patient was advised to monitor and report back to the office with plan to increase or escalate therapy if still elevated.  He had no reports of angina and lipids were stable and at goal.  Mr. Clenney presents today for overdue annual follow-up alone. The patient had a hair transplant procedure in September 2023, after which he experienced occasional shortness of breath with walking. The patient reports that this symptom has since resolved and he is not experiencing any chest pain or palpitations. The patient is active, walking at least three days a week for  thirty minutes. The patient is compliant with his medication regimen, which includes Plavix , aspirin , and Lipitor  for cholesterol management. The patient has not reported any adverse reactions to these medications.  Patient denies chest pain, palpitations, dyspnea, PND, orthopnea, nausea, vomiting, dizziness, syncope, edema, weight gain, or early satiety.  Review of Systems  Please see the history of present illness.    All other systems reviewed and are otherwise negative except as noted above.  Physical Exam    Wt Readings from Last 3 Encounters:  01/25/22 195 lb (88.5 kg)  11/03/20 188 lb 9.6 oz (85.5 kg)  10/24/19 171 lb (77.6 kg)   CD:Uyzmz were no vitals filed for this visit.,There is no height or weight on file to calculate BMI. GEN: Well nourished, well developed in no acute distress Neck: No JVD; No carotid bruits Pulmonary: Clear to auscultation without rales, wheezing or rhonchi  Cardiovascular: Normal rate. Regular rhythm. Normal S1. Normal S2.   Murmurs: There is no murmur.  ABDOMEN: Soft, non-tender, non-distended EXTREMITIES:  No edema; No deformity   EKG/LABS/ Recent Cardiac Studies   ECG personally reviewed by me today -sinus rhythm with rate of 71 bpm and no acute changes consistent with previous EKG.  Risk Assessment/Calculations:          Lab Results  Component Value Date   WBC 5.9 01/25/2022   HGB 14.6 01/25/2022   HCT 41.0 01/25/2022   MCV 87 01/25/2022  PLT 162 01/25/2022   Lab Results  Component Value Date   CREATININE 0.94 01/25/2022   BUN 16 01/25/2022   NA 139 01/25/2022   K 4.1 01/25/2022   CL 102 01/25/2022   CO2 23 01/25/2022   Lab Results  Component Value Date   CHOL 118 01/25/2022   HDL 40 01/25/2022   LDLCALC 64 01/25/2022   TRIG 66 01/25/2022   CHOLHDL 3.0 01/25/2022    Lab Results  Component Value Date   HGBA1C 5.7 (H) 03/06/2015   Assessment & Plan    1.  Coronary artery disease: -s/p NSTEMI 2016 treated with PCI/DES  x 1 to RCA -Today patient reports no chest pain or angina since previous follow-up. -Continue GDMT with Plavix  75 mg daily, ASA 81 mg daily and atorvastatin  80 mg daily -Patient encouraged to increase physical activity 250 minutes/week. -We will check CBC, BMET today  2.  Essential hypertension: -Patient's blood pressure today was controlled at 118/78 -Continue lifestyle modification and sodium control with Dash type diet  3.  Hyperlipidemia: -Patient has not had lipids checked in over 2 years -We will check LFTs and lipids along with LP(a)  4.  Healthcare maintenance: Patient is maintaining an active lifestyle with walking 3 days a week for 30 minutes. Discussed goal of 150 minutes of activity per week. -Encourage patient to add two more days of walking per week. -Plan for annual follow-up visits. Next visit scheduled for January 2026.  Disposition: Follow-up with Wilbert Bihari, MD or APP in 12 months    Signed, Wyn Raddle, Jackee Shove, NP 05/03/2023, 12:39 PM [facility]

## 2023-05-04 ENCOUNTER — Encounter: Payer: Self-pay | Admitting: Nurse Practitioner

## 2023-05-04 ENCOUNTER — Ambulatory Visit: Payer: Medicare HMO | Attending: Nurse Practitioner | Admitting: Nurse Practitioner

## 2023-05-04 VITALS — BP 118/78 | HR 67 | Resp 16 | Ht 68.0 in | Wt 191.0 lb

## 2023-05-04 DIAGNOSIS — I10 Essential (primary) hypertension: Secondary | ICD-10-CM | POA: Diagnosis not present

## 2023-05-04 DIAGNOSIS — I2510 Atherosclerotic heart disease of native coronary artery without angina pectoris: Secondary | ICD-10-CM | POA: Diagnosis not present

## 2023-05-04 DIAGNOSIS — E782 Mixed hyperlipidemia: Secondary | ICD-10-CM

## 2023-05-04 DIAGNOSIS — Z0000 Encounter for general adult medical examination without abnormal findings: Secondary | ICD-10-CM

## 2023-05-04 NOTE — Patient Instructions (Signed)
 Medication Instructions:  Your physician recommends that you continue on your current medications as directed. Please refer to the Current Medication list given to you today.  *If you need a refill on your cardiac medications before your next appointment, please call your pharmacy*  Lab Work: Your physician recommends that you have lab work today- CMET, Lipids, CBC, and Lp (a) If you have labs (blood work) drawn today and your tests are completely normal, you will receive your results only by: MyChart Message (if you have MyChart) OR A paper copy in the mail If you have any lab test that is abnormal or we need to change your treatment, we will call you to review the results.  Follow-Up: At [facility], you and your health needs are our priority.  As part of our continuing mission to provide you with exceptional heart care, we have created designated Provider Care Teams.  These Care Teams include your primary Cardiologist (physician) and Advanced Practice Providers (APPs -  Physician Assistants and Nurse Practitioners) who all work together to provide you with the care you need, when you need it.  We recommend signing up for the patient portal called MyChart.  Sign up information is provided on this After Visit Summary.  MyChart is used to connect with patients for Virtual Visits (Telemedicine).  Patients are able to view lab/test results, encounter notes, upcoming appointments, etc.  Non-urgent messages can be sent to your provider as well.   To learn more about what you can do with MyChart, go to [email or web address].    Your next appointment:   1 year(s)  Provider:   Wilbert Bihari, MD

## 2023-05-05 LAB — CBC WITH DIFFERENTIAL/PLATELET
Basophils Absolute: 0 10*3/uL (ref 0.0–0.2)
Basos: 1 %
EOS (ABSOLUTE): 0.1 10*3/uL (ref 0.0–0.4)
Eos: 1 %
Hematocrit: 44.6 % (ref 37.5–51.0)
Hemoglobin: 15.1 g/dL (ref 13.0–17.7)
Immature Grans (Abs): 0 10*3/uL (ref 0.0–0.1)
Immature Granulocytes: 0 %
Lymphocytes Absolute: 1.8 10*3/uL (ref 0.7–3.1)
Lymphs: 23 %
MCH: 30.1 pg (ref 26.6–33.0)
MCHC: 33.9 g/dL (ref 31.5–35.7)
MCV: 89 fL (ref 79–97)
Monocytes Absolute: 0.7 10*3/uL (ref 0.1–0.9)
Monocytes: 9 %
Neutrophils Absolute: 5.2 10*3/uL (ref 1.4–7.0)
Neutrophils: 66 %
Platelets: 179 10*3/uL (ref 150–450)
RBC: 5.01 x10E6/uL (ref 4.14–5.80)
RDW: 12 % (ref 11.6–15.4)
WBC: 7.9 10*3/uL (ref 3.4–10.8)

## 2023-05-05 LAB — COMPREHENSIVE METABOLIC PANEL
ALT: 24 [IU]/L (ref 0–44)
AST: 21 [IU]/L (ref 0–40)
Albumin: 4.3 g/dL (ref 3.9–4.9)
Alkaline Phosphatase: 73 [IU]/L (ref 44–121)
BUN/Creatinine Ratio: 15 (ref 10–24)
BUN: 16 mg/dL (ref 8–27)
Bilirubin Total: 0.8 mg/dL (ref 0.0–1.2)
CO2: 22 mmol/L (ref 20–29)
Calcium: 9.4 mg/dL (ref 8.6–10.2)
Chloride: 103 mmol/L (ref 96–106)
Creatinine, Ser: 1.07 mg/dL (ref 0.76–1.27)
Globulin, Total: 2.4 g/dL (ref 1.5–4.5)
Glucose: 83 mg/dL (ref 70–99)
Potassium: 4.1 mmol/L (ref 3.5–5.2)
Sodium: 141 mmol/L (ref 134–144)
Total Protein: 6.7 g/dL (ref 6.0–8.5)
eGFR: 77 mL/min/{1.73_m2} (ref >59)

## 2023-05-05 LAB — LIPID PANEL
Chol/HDL Ratio: 2.8 {ratio} (ref 0.0–5.0)
Cholesterol, Total: 133 mg/dL (ref 100–199)
HDL: 48 mg/dL (ref >39)
LDL Chol Calc (NIH): 69 mg/dL (ref 0–99)
Triglycerides: 84 mg/dL (ref 0–149)
VLDL Cholesterol Cal: 16 mg/dL (ref 5–40)

## 2023-05-05 LAB — LIPOPROTEIN A (LPA): Lipoprotein (a): 50.9 nmol/L (ref <75.0)

## 2023-06-05 ENCOUNTER — Other Ambulatory Visit (HOSPITAL_BASED_OUTPATIENT_CLINIC_OR_DEPARTMENT_OTHER): Payer: Self-pay | Admitting: Cardiology

## 2023-06-07 MED ORDER — METOPROLOL TARTRATE 25 MG PO TABS: 25.0000 mg | ORAL_TABLET | Freq: Two times a day (BID) | ORAL | 3 refills | Status: DC

## 2023-06-07 MED ORDER — CLOPIDOGREL BISULFATE 75 MG PO TABS: ORAL_TABLET | ORAL | 3 refills | Status: DC

## 2023-06-13 ENCOUNTER — Other Ambulatory Visit (HOSPITAL_BASED_OUTPATIENT_CLINIC_OR_DEPARTMENT_OTHER): Payer: Self-pay | Admitting: Cardiology

## 2023-06-13 MED ORDER — METOPROLOL TARTRATE 25 MG PO TABS: 25.0000 mg | ORAL_TABLET | Freq: Two times a day (BID) | ORAL | 3 refills | Status: AC

## 2023-06-13 MED ORDER — CLOPIDOGREL BISULFATE 75 MG PO TABS: ORAL_TABLET | ORAL | 3 refills | Status: AC

## 2023-06-13 NOTE — Addendum Note (Signed)
Addended by: Adriana Simas, Venus Ruhe L on: 06/13/2023 04:40 PM   Modules accepted: Orders

## 2023-07-17 MED ORDER — ATORVASTATIN CALCIUM 80 MG PO TABS: ORAL_TABLET | ORAL | 2 refills | Status: DC

## 2023-09-28 ENCOUNTER — Telehealth: Payer: Self-pay | Admitting: *Deleted

## 2023-09-28 NOTE — Telephone Encounter (Signed)
   Pre-operative Risk Assessment    Patient Name: Trystyn Dolley  DOB: 11/22/1955 MRN: [id number]   Date of last office visit: 05/04/23 Charles Connor, NP Date of next office visit: NONE   Request for Surgical Clearance    Procedure:  ROBOTIC RIGHT INGUINAL HERNIA REPAIR   Date of Surgery:  Clearance 10/20/23                                Surgeon:  DR. Debria Fang WHITE Surgeon's Group or Practice Name:  [facility] Phone number:  [phone] Fax number:  [phone]   Type of Clearance Requested:   - Medical  - Pharmacy:  Hold Clopidogrel  (Plavix ) x 5 DAYS PRIOR   Type of Anesthesia:  Not Indicated (GENERAL?)   Additional requests/questions:    Princ[place] Broom   09/28/2023, 10:12 AM

## 2023-09-28 NOTE — Telephone Encounter (Signed)
   Name: Benjamin Hoffman  DOB: Feb 24, 1956  MRN: [id number]  Primary Cardiologist: Gaylyn Keas, MD   Preoperative team, please contact this patient and set up a phone call appointment for further preoperative risk assessment. Please obtain consent and complete medication review. Thank you for your help.  I confirm that guidance regarding antiplatelet and oral anticoagulation therapy has been completed and, if necessary, noted below.  Patient with history of long stent.  He was previously cleared to hold Plavix  for 7 days by Dr. Micael Adas.  Therefore, he may hold Plavix  for 5 days prior to procedure.  Please resume Plavix  as soon as possible postprocedure, the discretion of the surgeon.  I also confirmed the patient resides in the state of [place] . As per [facility] Medical Board telemedicine laws, the patient must reside in the state in which the provider is licensed.   Jude Norton, NP 09/28/2023, 11:11 AM [facility]

## 2023-09-28 NOTE — Telephone Encounter (Signed)
 Tried to call the pt to set up tele preop appt, though no answer or vm came on.

## 2023-09-29 ENCOUNTER — Telehealth: Payer: Self-pay | Admitting: *Deleted

## 2023-09-29 NOTE — Telephone Encounter (Signed)
 Pt has been scheduled tele preop appt 10/06/23. Med rec and consent are done.

## 2023-09-29 NOTE — Telephone Encounter (Signed)
 Pt returning call

## 2023-09-29 NOTE — Telephone Encounter (Signed)
 Pt has been scheduled tele preop appt 10/06/23. Med rec and consent are done.     Patient Consent for Virtual Visit        Benjamin Hoffman has provided verbal consent on 09/29/2023 for a virtual visit (video or telephone).   CONSENT FOR VIRTUAL VISIT FOR:  Benjamin Hoffman  By participating in this virtual visit I agree to the following:  I hereby voluntarily request, consent and authorize [facility] and its employed or contracted physicians, physician assistants, nurse practitioners or other licensed health care professionals (the Practitioner), to provide me with telemedicine health care services (the "Services") as deemed necessary by the treating Practitioner. I acknowledge and consent to receive the Services by the Practitioner via telemedicine. I understand that the telemedicine visit will involve communicating with the Practitioner through live audiovisual communication technology and the disclosure of certain medical information by electronic transmission. I acknowledge that I have been given the opportunity to request an in-person assessment or other available alternative prior to the telemedicine visit and am voluntarily participating in the telemedicine visit.  I understand that I have the right to withhold or withdraw my consent to the use of telemedicine in the course of my care at any time, without affecting my right to future care or treatment, and that the Practitioner or I may terminate the telemedicine visit at any time. I understand that I have the right to inspect all information obtained and/or recorded in the course of the telemedicine visit and may receive copies of available information for a reasonable fee.  I understand that some of the potential risks of receiving the Services via telemedicine include:  Delay or interruption in medical evaluation due to technological equipment failure or disruption; Information transmitted may not be sufficient (e.g. poor resolution of  images) to allow for appropriate medical decision making by the Practitioner; and/or  In rare instances, security protocols could fail, causing a breach of personal health information.  Furthermore, I acknowledge that it is my responsibility to provide information about my medical history, conditions and care that is complete and accurate to the best of my ability. I acknowledge that Practitioner's advice, recommendations, and/or decision may be based on factors not within their control, such as incomplete or inaccurate data provided by me or distortions of diagnostic images or specimens that may result from electronic transmissions. I understand that the practice of medicine is not an exact science and that Practitioner makes no warranties or guarantees regarding treatment outcomes. I acknowledge that a copy of this consent can be made available to me via my patient portal [facility] MyChart), or I can request a printed copy by calling the office of [facility].    I understand that my insurance will be billed for this visit.   I have read or had this consent read to me. I understand the contents of this consent, which adequately explains the benefits and risks of the Services being provided via telemedicine.  I have been provided ample opportunity to ask questions regarding this consent and the Services and have had my questions answered to my satisfaction. I give my informed consent for the services to be provided through the use of telemedicine in my medical care

## 2023-09-29 NOTE — Telephone Encounter (Signed)
 2nd attempt to reach pt though no answer or vm.   I will update the requesting office the pt needs to call our office to set up tele visit for preop clearance.

## 2023-10-06 ENCOUNTER — Ambulatory Visit: Attending: Cardiology | Admitting: Emergency Medicine

## 2023-10-06 DIAGNOSIS — Z01810 Encounter for preprocedural cardiovascular examination: Secondary | ICD-10-CM | POA: Diagnosis not present

## 2023-10-06 NOTE — Progress Notes (Signed)
 Virtual Visit via Telephone Note   Because of Benjamin Hoffman co-morbid illnesses, he is at least at moderate risk for complications without adequate follow up.  This format is felt to be most appropriate for this patient at this time.  Due to technical limitations with video connection (technology), today's appointment will be conducted as an audio only telehealth visit, and Benjamin Hoffman verbally agreed to proceed in this manner.   All issues noted in this document were discussed and addressed.  No physical exam could be performed with this format.  Evaluation Performed:  Preoperative cardiovascular risk assessment _____________   Date:  10/06/2023   Patient ID:  Benjamin Hoffman, DOB 1956/02/20, MRN [id number] Patient Location:  Home Provider location:   Office  Primary Care Provider:  Karolynn Pack, MD Primary Cardiologist:  Gaylyn Keas, MD  Chief Complaint / Patient Profile   68 y.o. y/o male with a h/o coronary artery disease s/p NSTEMI, hyperlipidemia, hypertension who is pending robotic right inguinal hernia repair on 10/20/2023 with [facility] and presents today for telephonic preoperative cardiovascular risk assessment.  History of Present Illness    Benjamin Hoffman is a 68 y.o. male who presents via audio/video conferencing for a telehealth visit today.  Pt was last seen in cardiology clinic on 05/04/2023 by Charles Connor, NP.  At that time Benjamin Hoffman was doing well.  The patient is now pending procedure as outlined above. Since his last visit, he denies chest pain, shortness of breath, lower extremity edema, fatigue, palpitations, melena, hematuria, hemoptysis, diaphoresis, weakness, presyncope, syncope, orthopnea, and PND.  Today patient is doing well overall.  He is without any acute cardiovascular concerns or complaints at this time.  He denies any chest pains or any exertional/anginal symptoms.  Notes that he stays fairly active where he is easily able to walk up steps, does hours of  housecleaning every Wednesday, and mows frequently with at least 45 minutes of weed eating at a time.  Overall he is doing well and asymptomatic.  He is easily able to complete greater than 4 METS. Past Medical History    Past Medical History:  Diagnosis Date   CAD (coronary artery disease), native coronary artery 03/2015   a. NSTEMI 2016 s/p DES to RCA.   Hyperlipidemia LDL goal <70    NSTEMI (non-ST elevated myocardial infarction) (HCC) 03/2015   S/P angioplasty with stent 03/2015   to RCA, with DES   Past Surgical History:  Procedure Laterality Date   APPENDECTOMY     CARDIAC CATHETERIZATION N/A 03/06/2015   Procedure: Left Heart Cath and Coronary Angiography;  Surgeon: Arleen Lacer, MD;  Location: [facility] INVASIVE CV LAB;  Service: Cardiovascular;  Laterality: N/A;   CARDIAC CATHETERIZATION N/A 03/06/2015   Procedure: Coronary Stent Intervention;  Surgeon: Arleen Lacer, MD;  Location: [facility] INVASIVE CV LAB;  Service: Cardiovascular;  Laterality: N/A;    Allergies  Allergies  Allergen Reactions   Penicillins Hives, Swelling and Rash    Home Medications    Prior to Admission medications   Medication Sig Start Date End Date Taking? Authorizing Provider  aspirin  EC 81 MG tablet Take 1 tablet (81 mg total) by mouth daily. 06/20/16   Odie Benne, MD  atorvastatin  (LIPITOR ) 80 MG tablet TAKE 1 TABLET(80 MG) BY MOUTH DAILY 07/17/23   Gerald Kitty., NP  clopidogrel  (PLAVIX ) 75 MG tablet TAKE 1 TABLET DAILY 06/13/23   Jacqueline Matsu, MD  finasteride (PROSCAR) 5 MG tablet  Take 2.5 mg by mouth daily. 03/20/15   [provider]  metoprolol  tartrate (LOPRESSOR ) 25 MG tablet Take 1 tablet (25 mg total) by mouth 2 (two) times daily. 06/13/23   Jacqueline Matsu, MD  nitroGLYCERIN  (NITROSTAT ) 0.4 MG SL tablet DISSOLVE 1 TABLET UNDER THE TONGUE EVERY 5 MINUTES FOR A TOTAL OF 3 DOSES AS NEEDED FOR CHEST PAIN Patient not taking: Reported on 09/29/2023 11/14/19   Jacqueline Matsu,  MD    Physical Exam    Vital Signs:  Benjamin Hoffman does not have vital signs available for review today.   Given telephonic nature of communication, physical exam is limited. AAOx3. NAD. Normal affect.  Speech and respirations are unlabored.  Accessory Clinical Findings    None  Assessment & Plan    1.  Preoperative Cardiovascular Risk Assessment: According to the Revised Cardiac Risk Index (RCRI), his Perioperative Risk of Major Cardiac Event is (%): 0.9. His Functional Capacity in METs is: 5.62 according to the Duke Activity Status Index (DASI). Therefore, based on ACC/AHA guidelines, patient would be at acceptable risk for the planned procedure without further cardiovascular testing.  The patient was advised that if he develops new symptoms prior to surgery to contact our office to arrange for a follow-up visit, and he verbalized understanding.  Patient with history of long stent. He was previously cleared to hold Plavix  for 7 days by Dr. Micael Adas. Therefore, he may hold Plavix  for 5 days prior to procedure. Please resume Plavix  as soon as possible postprocedure, the discretion of the surgeon.   A copy of this note will be routed to requesting surgeon.  Time:   Today, I have spent 7 minutes with the patient with telehealth technology discussing medical history, symptoms, and management plan.     Ava Boatman, NP  10/06/2023, 10:49 AM

## 2023-10-13 NOTE — Telephone Encounter (Signed)
 Faxed over EKG from 05/22/23 per request of Ave Leisure, RN Faxed to [phone].

## 2023-10-13 NOTE — Telephone Encounter (Signed)
 RN Ave Leisure called requesting for a copy of the patient's most recent EKG on 05/04/23. Ave Leisure requested for us  to fax it at [phone]. Please advise.

## 2024-04-08 ENCOUNTER — Other Ambulatory Visit: Payer: Self-pay | Admitting: Nurse Practitioner
